# Patient Record
Sex: Female | Born: 1990
Health system: Southern US, Community
[De-identification: ages and names within clinical notes are randomized; demographics above are authoritative.]

## PROBLEM LIST (undated history)

## (undated) DIAGNOSIS — E559 Vitamin D deficiency, unspecified: Secondary | ICD-10-CM

## (undated) DIAGNOSIS — O24419 Gestational diabetes mellitus in pregnancy, unspecified control: Secondary | ICD-10-CM

## (undated) HISTORY — DX: Gestational diabetes mellitus in pregnancy, unspecified control: O24.419

## (undated) HISTORY — DX: Vitamin D deficiency, unspecified: E55.9

---

## 2019-05-31 DIAGNOSIS — Z0000 Encounter for general adult medical examination without abnormal findings: Secondary | ICD-10-CM | POA: Diagnosis not present

## 2019-05-31 DIAGNOSIS — Z23 Encounter for immunization: Secondary | ICD-10-CM | POA: Diagnosis not present

## 2019-05-31 DIAGNOSIS — Z1389 Encounter for screening for other disorder: Secondary | ICD-10-CM | POA: Diagnosis not present

## 2019-05-31 DIAGNOSIS — Z13220 Encounter for screening for lipoid disorders: Secondary | ICD-10-CM | POA: Diagnosis not present

## 2019-05-31 DIAGNOSIS — R21 Rash and other nonspecific skin eruption: Secondary | ICD-10-CM | POA: Diagnosis not present

## 2019-06-07 ENCOUNTER — Other Ambulatory Visit: Payer: Self-pay

## 2019-06-07 DIAGNOSIS — O209 Hemorrhage in early pregnancy, unspecified: Secondary | ICD-10-CM | POA: Diagnosis not present

## 2019-06-07 DIAGNOSIS — Z20822 Contact with and (suspected) exposure to covid-19: Secondary | ICD-10-CM

## 2019-06-07 DIAGNOSIS — Z3201 Encounter for pregnancy test, result positive: Secondary | ICD-10-CM | POA: Diagnosis not present

## 2019-06-08 LAB — NOVEL CORONAVIRUS, NAA: SARS-CoV-2, NAA: NOT DETECTED

## 2019-06-13 DIAGNOSIS — Z3201 Encounter for pregnancy test, result positive: Secondary | ICD-10-CM | POA: Diagnosis not present

## 2019-06-13 DIAGNOSIS — Z3490 Encounter for supervision of normal pregnancy, unspecified, unspecified trimester: Secondary | ICD-10-CM | POA: Diagnosis not present

## 2019-06-17 DIAGNOSIS — O209 Hemorrhage in early pregnancy, unspecified: Secondary | ICD-10-CM | POA: Diagnosis not present

## 2019-06-25 DIAGNOSIS — O209 Hemorrhage in early pregnancy, unspecified: Secondary | ICD-10-CM | POA: Diagnosis not present

## 2019-07-05 DIAGNOSIS — Z23 Encounter for immunization: Secondary | ICD-10-CM | POA: Diagnosis not present

## 2019-07-05 DIAGNOSIS — Z3401 Encounter for supervision of normal first pregnancy, first trimester: Secondary | ICD-10-CM | POA: Diagnosis not present

## 2019-09-06 NOTE — L&D Delivery Note (Signed)
Delivery Note   Patient Name: Andrea Escobar DOB: 1990-09-19 MRN: [id number]  Date of admission: 11/27/2019 Delivering MD: Dorisann Frames K  Date of delivery: 12/11/2019 Type of delivery: SVD  Newborn Data: Live born female  Birth Weight: 4 lb 2 oz (1870 g) APGAR: 9, 10  Newborn Delivery   Birth date/time: 12/11/2019 03:45:00 Delivery type: Vaginal, Spontaneous     [name], 29 y.o., @ [redacted]w[redacted]d,  G1P0101, who was admitted for PPROM on 11/27/2019. She was transferred to L&D from antepartum and was C/+2. I was called to the room when she progressed +2 station in the second stage of labor. She delivered a viable infant, cephalic and restituted to the ROA position.  A nuchal cord  was not identified. The baby was placed on maternal abdomen while initial step of NRP were perfmored (Dry, Stimulated, and warmed). Hat placed on baby for thermoregulation. Delayed cord clamping was performed for 1 minute.  Cord double clamped and cut.  Cord cut by FOB. Apgar scores were 9 and 10. Prophylactic Pitocin was started in the third stage of labor for active management. Upon placental separation, cord avulsed and Dr. Adrian Blackwater was called to room for manual extraction of the placenta. Please see note from Dr. Adrian Blackwater. A 3 vessel cord was noted and placenta was sent to pathology.  Inspection revealed 1st degree laceration. The repair was done under epidural anesthesia. The uterus was firm, bleeding stable. Placenta and umbilical artery blood gas were not sent.  Baby to NICU. Mom in stable condition.  Maternal Info: Anesthesia:Epidural Episiotomy: N/A Lacerations:  1st degree Suture Repair: Yes, 3-0 Vicryl Est. Blood Loss (mL):  427  Newborn Info: Baby Sex: Female APGAR (1 MIN): 9   APGAR (5 MINS): 10    Mom to postpartum.  Baby to NICU.  June Leap, CNM, MSN 12/11/2019 4:42 AM

## 2019-09-19 DIAGNOSIS — Z36 Encounter for antenatal screening for chromosomal anomalies: Secondary | ICD-10-CM | POA: Diagnosis not present

## 2019-09-19 DIAGNOSIS — Z3402 Encounter for supervision of normal first pregnancy, second trimester: Secondary | ICD-10-CM | POA: Diagnosis not present

## 2019-11-08 DIAGNOSIS — Z3402 Encounter for supervision of normal first pregnancy, second trimester: Secondary | ICD-10-CM | POA: Diagnosis not present

## 2019-11-27 ENCOUNTER — Other Ambulatory Visit: Payer: Self-pay

## 2019-11-27 ENCOUNTER — Encounter (HOSPITAL_COMMUNITY): Payer: Self-pay | Admitting: Obstetrics & Gynecology

## 2019-11-27 ENCOUNTER — Encounter (HOSPITAL_COMMUNITY): Payer: Self-pay | Admitting: Anesthesiology

## 2019-11-27 ENCOUNTER — Inpatient Hospital Stay (HOSPITAL_COMMUNITY)
Admission: AD | Admit: 2019-11-27 | Discharge: 2019-12-13 | DRG: 798 | Disposition: A | Discharge status: Home / Self Care | Payer: Medicaid Other | Attending: Obstetrics & Gynecology | Admitting: Obstetrics & Gynecology

## 2019-11-27 ENCOUNTER — Inpatient Hospital Stay (HOSPITAL_COMMUNITY): Payer: Medicaid Other

## 2019-11-27 DIAGNOSIS — K5900 Constipation, unspecified: Secondary | ICD-10-CM | POA: Diagnosis present

## 2019-11-27 DIAGNOSIS — O24419 Gestational diabetes mellitus in pregnancy, unspecified control: Secondary | ICD-10-CM

## 2019-11-27 DIAGNOSIS — Z3A30 30 weeks gestation of pregnancy: Secondary | ICD-10-CM

## 2019-11-27 DIAGNOSIS — O42913 Preterm premature rupture of membranes, unspecified as to length of time between rupture and onset of labor, third trimester: Secondary | ICD-10-CM | POA: Diagnosis present

## 2019-11-27 DIAGNOSIS — Z3A29 29 weeks gestation of pregnancy: Secondary | ICD-10-CM

## 2019-11-27 DIAGNOSIS — O24420 Gestational diabetes mellitus in childbirth, diet controlled: Secondary | ICD-10-CM | POA: Diagnosis present

## 2019-11-27 DIAGNOSIS — O42919 Preterm premature rupture of membranes, unspecified as to length of time between rupture and onset of labor, unspecified trimester: Secondary | ICD-10-CM | POA: Diagnosis present

## 2019-11-27 DIAGNOSIS — O26893 Other specified pregnancy related conditions, third trimester: Secondary | ICD-10-CM | POA: Diagnosis present

## 2019-11-27 DIAGNOSIS — Z20822 Contact with and (suspected) exposure to covid-19: Secondary | ICD-10-CM | POA: Diagnosis present

## 2019-11-27 DIAGNOSIS — O24313 Unspecified pre-existing diabetes mellitus in pregnancy, third trimester: Secondary | ICD-10-CM | POA: Diagnosis not present

## 2019-11-27 DIAGNOSIS — O42113 Preterm premature rupture of membranes, onset of labor more than 24 hours following rupture, third trimester: Secondary | ICD-10-CM | POA: Diagnosis not present

## 2019-11-27 LAB — TYPE AND SCREEN
ABO/RH(D): B POS
Antibody Screen: NEGATIVE

## 2019-11-27 LAB — GROUP B STREP BY PCR: Group B strep by PCR: NEGATIVE

## 2019-11-27 LAB — SARS CORONAVIRUS 2 (TAT 6-24 HRS): SARS Coronavirus 2: NEGATIVE

## 2019-11-27 LAB — CBC
HCT: 33.6 % — ABNORMAL LOW (ref 36.0–46.0)
Hemoglobin: 10.5 g/dL — ABNORMAL LOW (ref 12.0–15.0)
MCH: 24.3 pg — ABNORMAL LOW (ref 26.0–34.0)
MCHC: 31.3 g/dL (ref 30.0–36.0)
MCV: 77.8 fL — ABNORMAL LOW (ref 80.0–100.0)
Platelets: 189 10*3/uL (ref 150–400)
RBC: 4.32 MIL/uL (ref 3.87–5.11)
RDW: 16.3 % — ABNORMAL HIGH (ref 11.5–15.5)
WBC: 13.3 10*3/uL — ABNORMAL HIGH (ref 4.0–10.5)
nRBC: 0 % (ref 0.0–0.2)

## 2019-11-27 LAB — URINALYSIS, ROUTINE W REFLEX MICROSCOPIC
Bilirubin Urine: NEGATIVE
Glucose, UA: NEGATIVE mg/dL
Hgb urine dipstick: NEGATIVE
Ketones, ur: NEGATIVE mg/dL
Leukocytes,Ua: NEGATIVE
Nitrite: NEGATIVE
Protein, ur: NEGATIVE mg/dL
Specific Gravity, Urine: 1.008 (ref 1.005–1.030)
pH: 6 (ref 5.0–8.0)

## 2019-11-27 LAB — GLUCOSE, CAPILLARY
Glucose-Capillary: 176 mg/dL — ABNORMAL HIGH (ref 70–99)
Glucose-Capillary: 103 mg/dL — ABNORMAL HIGH (ref 70–99)

## 2019-11-27 LAB — ABO/RH: ABO/RH(D): B POS

## 2019-11-27 LAB — POCT FERN TEST: POCT Fern Test: POSITIVE

## 2019-11-27 MED ORDER — DOCUSATE SODIUM 100 MG PO CAPS
100.0000 mg | ORAL_CAPSULE | Freq: Every day | ORAL | Status: DC
  Administered 2019-11-27 – 2019-12-02 (×6): 100 mg via ORAL
  Filled 2019-11-27 (×6): qty 1

## 2019-11-27 MED ORDER — ACETAMINOPHEN 325 MG PO TABS
650.0000 mg | ORAL_TABLET | ORAL | Status: DC | PRN
  Administered 2019-12-09: 05:00:00 650 mg via ORAL
  Filled 2019-11-27: qty 2

## 2019-11-27 MED ORDER — AZITHROMYCIN 250 MG PO TABS
1000.0000 mg | ORAL_TABLET | Freq: Once | ORAL | Status: AC
  Administered 2019-11-27: 1000 mg via ORAL
  Filled 2019-11-27: qty 4

## 2019-11-27 MED ORDER — INDOMETHACIN 25 MG PO CAPS
25.0000 mg | ORAL_CAPSULE | Freq: Four times a day (QID) | ORAL | Status: AC
  Administered 2019-11-27 – 2019-11-30 (×12): 25 mg via ORAL
  Filled 2019-11-27 (×13): qty 1

## 2019-11-27 MED ORDER — TETANUS-DIPHTH-ACELL PERTUSSIS 5-2.5-18.5 LF-MCG/0.5 IM SUSP
0.5000 mL | Freq: Once | INTRAMUSCULAR | Status: AC
  Administered 2019-11-27: 0.5 mL via INTRAMUSCULAR
  Filled 2019-11-27: qty 0.5

## 2019-11-27 MED ORDER — TERBUTALINE SULFATE 1 MG/ML IJ SOLN: 0.2500 mg | Freq: Once | INTRAMUSCULAR | Status: DC | PRN

## 2019-11-27 MED ORDER — CALCIUM CARBONATE ANTACID 500 MG PO CHEW: 2.0000 | CHEWABLE_TABLET | ORAL | Status: DC | PRN

## 2019-11-27 MED ORDER — AMOXICILLIN 500 MG PO CAPS
500.0000 mg | ORAL_CAPSULE | Freq: Three times a day (TID) | ORAL | Status: AC
  Administered 2019-11-29 – 2019-12-03 (×15): 500 mg via ORAL
  Filled 2019-11-27 (×15): qty 1

## 2019-11-27 MED ORDER — SODIUM CHLORIDE 0.9 % IV SOLN
2.0000 g | Freq: Four times a day (QID) | INTRAVENOUS | Status: AC
  Administered 2019-11-27 – 2019-11-29 (×8): 2 g via INTRAVENOUS
  Filled 2019-11-27 (×8): qty 2000

## 2019-11-27 MED ORDER — LACTATED RINGERS IV SOLN: INTRAVENOUS | Status: DC

## 2019-11-27 MED ORDER — ZOLPIDEM TARTRATE 5 MG PO TABS: 5.0000 mg | ORAL_TABLET | Freq: Every evening | ORAL | Status: DC | PRN

## 2019-11-27 MED ORDER — BETAMETHASONE SOD PHOS & ACET 6 (3-3) MG/ML IJ SUSP
12.0000 mg | INTRAMUSCULAR | Status: AC
  Administered 2019-11-27 – 2019-11-28 (×2): 12 mg via INTRAMUSCULAR
  Filled 2019-11-27: qty 5

## 2019-11-27 MED ORDER — PRENATAL MULTIVITAMIN CH
1.0000 | ORAL_TABLET | Freq: Every day | ORAL | Status: DC
  Administered 2019-11-27 – 2019-12-08 (×12): 1 via ORAL
  Filled 2019-11-27 (×12): qty 1

## 2019-11-27 MED ORDER — INDOMETHACIN 25 MG PO CAPS
50.0000 mg | ORAL_CAPSULE | Freq: Once | ORAL | Status: AC
  Administered 2019-11-27: 50 mg via ORAL
  Filled 2019-11-27: qty 2

## 2019-11-27 NOTE — ED Triage Notes (Addendum)
Pt [redacted]w[redacted]d pregnant.  Reports clear fluid leaking since 3am.  States she had abd pain earlier but denies pain at present.  G1.  Dr. Anitra Lauth at triage for [facility].  Report called to [facility] in MAU. Pt transported via wheelchair to MAU by EMT.

## 2019-11-27 NOTE — Plan of Care (Signed)
  Problem: Education: Goal: Knowledge of General Education information will improve Description: Including pain rating scale, medication(s)/side effects and non-pharmacologic comfort measures Outcome: Completed/Met

## 2019-11-27 NOTE — Consult Note (Signed)
   Prenatal Consult       11/27/2019  9:06 PM   I was asked by Dr. Charlotta Newton to consult on this patient for possible preterm delivery.  I had the pleasure of meeting with her today, and a [place] virtual interpreter was used to facilitate the visit.  She is a G1P0 admitted today for PPROM at 30+[redacted] weeks gestation.  Pregnancy has otherwise been complicated by recently diagnosed gestational diabetes.  I explained that the neonatal intensive care team would be present for the delivery and outlined the likely delivery room course for this baby including routine resuscitation and NRP-guided approaches to the treatment of respiratory distress. We discussed other common problems associated with prematurity including respiratory distress syndrome/CLD, apnea, feeding issues, temperature regulation, and infection risk.  We briefly discussed IVH/PVL, ROP, and NEC and that these are complications associated with prematurity, but that by 30 weeks are uncommon.    We discussed the average length of stay but I noted that the actual LOS would depend on the severity of problems encountered and response to treatments.  We discussed visitation policies and the resources available while her child is in the hospital.  We discussed the importance of good nutrition and various methods of providing nutrition (parenteral hyperalimentation, gavage feedings and/or oral feeding). We discussed the benefits of human milk. I encouraged breast feeding and pumping soon after birth and outlined resources that are available to support breast feeding.   Thank you for involving [place] in the care of this patient. A member of our team will be available should the family have additional questions.  Time for consultation approximately 40 minutes.   _____________________ Electronically Signed By: Karie Schwalbe, MD, MS Neonatologist

## 2019-11-27 NOTE — MAU Note (Signed)
Andrea Escobar is a 29 y.o. at [redacted]w[redacted]d here in MAU reporting: had lower abdominal pain yesterday. Was not able to sleep due to the pain. Around 0300 she went to the bathroom and her underwear was wet but it was not urinate, had this happen 2 times. Is not wearing a pad. No recent IC. No bleeding. +FM  Onset of complaint: today  Pain score: 10/10  Vitals:   11/27/19 0720 11/27/19 0759  BP: 110/81 110/67  Pulse: 98 99  Resp: 14 16  Temp: 98.9 F (37.2 C) 99.5 F (37.5 C)  SpO2: 99% 100%     FHT: +FM  Lab orders placed from triage: UA

## 2019-11-27 NOTE — MAU Provider Note (Signed)
S: Andrea Escobar is a 29 y.o. G1P0 at [redacted]w[redacted]d  who presents to MAU today complaining of leaking of fluid since 0300. She denies vaginal bleeding. She endorses abdominal pain since . She reports normal fetal movement.    O: BP 110/67 (BP Location: Right Arm)   Pulse 99   Temp 99.5 F (37.5 C) (Oral)   Resp 16   Ht 5\' 6"  (1.676 m)   Wt 90.4 kg   SpO2 100% Comment: room air  BMI 32.18 kg/m  GENERAL: Well-developed, well-nourished female in no acute distress.  HEAD: Normocephalic, atraumatic.  CHEST: Normal effort of breathing, regular heart rate ABDOMEN: Soft, nontender, gravid PELVIC: Deferred. Grossly rupture of membranes seen.   Cervical exam:  Deferred  Fetal Monitoring: Baseline: 150 Variability: moderate Accelerations: present Decelerations: absent Contractions: irregular contractions and UI noted  No results found for this or any previous visit (from the past 24 hour(s)).   A: SIUP at [redacted]w[redacted]d  PPROM  *Consult with Dr. [redacted]w[redacted]d @ 0840 via Charlotta Newton, RN (in surgery) - notified of patient's complaints, assessments, (+) fern results, tx plan rapid GBS testing, BMZ 12 mg IM and admission - Dr. Marvell Fuller to call back after surgery.  Dr. Charlotta Newton on the unit @ 0915 -- update given. In to see patient and assumes care at this time.   P: Preterm premature rupture of membranes in third trimester, unspecified duration to onset of labor - Admit to [facility] unit - Dr. [facility] will place admission orders - BMZ 12 mg 2nd injection to be repeated in 24 hrs   Charlotta Newton, CNM 11/27/2019, 8:35 AM

## 2019-11-27 NOTE — H&P (Signed)
HPI: 29 y/o G1P0 @ [redacted]w[redacted]d estimated gestational age (as dated by early [place]- @ 7wks) admitted for PPROM.  Pt presented to MAU due to leaking of fluid since 0300.  She denies pelvic or abdominal pain.  She has noted intermittent cramping throughout the week, but pain was irregular and not strong.  Notes the pain was in her lower abdomen.  She denies pain currently.  No vaginal bleeding. +fetal movement.  No other acute complaints  ROS: no HA, no epigastric pain, no visual changes.    Pregnancy complicated by: 1)Gestational diabetes -recently diagnosed on 11/22/19- failed 3hr (94, 177, 193, 149), prior 1hr test 157   Prenatal Transfer Tool  Maternal Diabetes: Yes:  Diabetes Type:  Diet controlled- newly diagnosed Genetic Screening: Normal Maternal Ultrasounds/Referrals: Normal Fetal Ultrasounds or other Referrals:  None Maternal Substance Abuse:  No Significant Maternal Medications:  None Significant Maternal Lab Results: None   PNL:  GBS negative, Rub Immune, Hep B neg, RPR NR, HIV neg, GC/C neg, glucola:abnormal Hgb: 10.5 Blood type: B positive, antibody neg  Immunizations: Tdap: not yet given Flu: 07/05/19  OBHx: primip PMHx:  none Meds:  PNV Allergy:  No Known Allergies SurgHx: none SocHx:   no Tobacco, no  EtOH, no Illicit Drugs  O: BP 102/61 (BP Location: Right Arm)   Pulse 93   Temp 98.4 F (36.9 C) (Oral)   Resp 17   Ht 5\' 6"  (1.676 m)   Wt 90.4 kg   SpO2 100%   BMI 32.18 kg/m  Gen. AAOx3, NAD CV.  RRR  No murmur.  Resp. CTAB, no wheeze or crackles. Abd. Gravid,  no tenderness,  no rigidity,  no guarding Extr.  no edema B/L , no calf tenderness, neg Homan's B/L FHT: 150 baseline, moderate variability, + accels,  no decels Toco: irritability noted SVE: deferred  SSE: per MAU- grossly ruptured BSUS: VTX   Labs: see orders  A/P:  29 y.o. G1P0 @ [redacted]w[redacted]d EGA who presents for PPROM  1) FWB:  Cat. I - plan for fetal monitoring q shift x [redacted]w[redacted]d -BMZ to be given for  fetal lung maturity -MFM consult and to be done, appreciate further recommendations regarding follow up US -Neonatology consulted  2) Preterm labor -Latency antibiotics ordered -Indomethacin for tocolysis -currently no evidence of infection -UA and culture pending, GBS negative -should pt note signs of labor, plan to start Magnesium for CP prophylaxis -continue IVF @ 125cc/hr  3) Gestational DM -diabetic coordinator consulted -accuchecks fasting and 2hr postprandial -Since she is newly diagnosed will hold off on medication until teaching has been completed and in light of recent BMZ -gestational diabetic diet ordered -plan to closely monitor  4) Maternal care -Tylenol prn pain -Bedrest with bathroom privileges -ok for wheelchair rides  [place], Myna H[place]go [place] (cell) [phone] (office)

## 2019-11-28 LAB — URINE CULTURE: Culture: NO GROWTH

## 2019-11-28 LAB — GLUCOSE, CAPILLARY
Glucose-Capillary: 110 mg/dL — ABNORMAL HIGH (ref 70–99)
Glucose-Capillary: 96 mg/dL (ref 70–99)
Glucose-Capillary: 199 mg/dL — ABNORMAL HIGH (ref 70–99)
Glucose-Capillary: 146 mg/dL — ABNORMAL HIGH (ref 70–99)
Glucose-Capillary: 135 mg/dL — ABNORMAL HIGH (ref 70–99)

## 2019-11-28 MED ORDER — INSULIN ASPART 100 UNIT/ML ~~LOC~~ SOLN: 0.0000 [IU] | Freq: Three times a day (TID) | SUBCUTANEOUS | Status: DC

## 2019-11-28 MED ORDER — INSULIN ASPART 100 UNIT/ML ~~LOC~~ SOLN
0.0000 [IU] | Freq: Three times a day (TID) | SUBCUTANEOUS | Status: DC
  Administered 2019-11-28: 2 [IU] via SUBCUTANEOUS
  Administered 2019-11-29 (×3): 1 [IU] via SUBCUTANEOUS
  Administered 2019-11-30: 2 [IU] via SUBCUTANEOUS
  Administered 2019-11-30 – 2019-12-01 (×3): 1 [IU] via SUBCUTANEOUS

## 2019-11-28 NOTE — Consult Note (Signed)
MFM Note  Andrea Escobar was seen in consultation due to PPROM.  She is currently at 29 weeks 5 days.  This is her first pregnancy. The patient reports that she probably ruptured membranes at around 3 am this morning.  She reports that she is still leaking a small amount of fluid and reports feeling fetal movements throughout the day. She also notes intermittent mild lower abdominal cramping. She was recently diagnosed with gestational diabetes. She denies any significant past medical history.  The patient had an ultrasound performed this afternoon that showed an overall EFW of 3 pounds 8 ounces (68th percentile for her gestational age). Normal amniotic fluid with a total AFI of 12.78 cm was noted. The fetus was in the vertex presentation. Her cervix was noted to be funneling and probably is fingertip to 1 cm dilated as noted on the abdominal ultrasound.   Due to PPROM at her current gestational age, the patient is receiving latency antibiotics and a complete course of antenatal corticosteroids.  The usual management and implications of PPROM were discussed with the patient.  She was advised that due to rupture of membranes, she will require inpatient management until delivery with daily fetal testing.  She should receive a complete course of antenatal corticosteroids and complete a course of latency antibiotics.  She was advised that due to PPROM, delivery will be recommended at around 34 weeks.  Delivery prior to this time will be indicated should she go into spontaneous labor, should she show any signs of an intrauterine infection, or at any time for nonreassuring fetal status.  Should she require delivery before 32 weeks, magnesium sulfate should be given for fetal neuro protection.  Should she be at risk for delivery and it has been 7 days or greater since she received the initial course of antenatal corticosteroids, a rescue course of steroids should be given.  The patient understands that her  baby will require a NICU admission following delivery.  Due to gestational diabetes, the patient should continue to have her fingersticks monitored on a daily basis and should be started on either Metformin or insulin should the majority of her fingerstick values be elevated above the normal range (fasting fingerstick values of 95 mg/dL or less  and 2 hours postprandial values of 120 mg/dL or less).  All conversations were held with the patient with the help of a [place] interpreter.  At the end of the consultation, the patient stated that all her questions had been answered to her complete satisfaction.  Thank you for referring this patient for Maternal-Fetal Medicine consultation.   Recommendations: -Inpatient management until delivery -Daily fetal testing -Weekly biophysical profiles with fluid checks -Latency antibiotics -Magnesium sulfate for fetal neuro protection should she require delivery before 32 weeks -Rescue course of steroids as indicated -Delivery at around 34 weeks or earlier should she go into spontaneous labor, should she show any signs of an intrauterine infection, or at any time for nonreassuring fetal status -Monitor daily fingerstick values and institute appropriate treatment if necessary

## 2019-11-28 NOTE — Progress Notes (Signed)
HD #2- PPROM @ 30wks  S: She is resting comfortably in bed and reports no acute complaints.  No contractions, vaginal bleeding, still leaking small amount of fluid.  She is feeling baby move, but less than before.    O: BP (!) 94/58 (BP Location: Right Arm)   Pulse 85   Temp 98 F (36.7 C) (Oral)   Resp 16   Ht 5\' 6"  (1.676 m)   Wt 90.4 kg   SpO2 98%   BMI 32.18 kg/m   Gen: NAD CV: RRR Lungs: CTAB Abd: gravid and non-tender Ext: no edema, no calf tenderness bilaterally  FHT: 140, moderate variability, +accels, no decels Toco: no contractions  on 3/24: vertex/right lateral placenta/AFI 12.7/EFW: 3#8oz (68%) Results for orders placed or performed during the hospital encounter of 11/27/19 (from the past 24 hour(s))  Urinalysis, Routine w reflex microscopic     Status: Abnormal   Collection Time: 11/27/19  8:01 AM  Result Value Ref Range   Color, Urine STRAW (A) YELLOW   APPearance CLEAR CLEAR   Specific Gravity, Urine 1.008 1.005 - 1.030   pH 6.0 5.0 - 8.0   Glucose, UA NEGATIVE NEGATIVE mg/dL   Hgb urine dipstick NEGATIVE NEGATIVE   Bilirubin Urine NEGATIVE NEGATIVE   Ketones, ur NEGATIVE NEGATIVE mg/dL   Protein, ur NEGATIVE NEGATIVE mg/dL   Nitrite NEGATIVE NEGATIVE   Leukocytes,Ua NEGATIVE NEGATIVE  Group B strep by PCR     Status: None   Collection Time: 11/27/19  8:37 AM   Specimen: Vaginal/Rectal; Genital  Result Value Ref Range   Group B strep by PCR NEGATIVE NEGATIVE  Fern Test     Status: None   Collection Time: 11/27/19  8:39 AM  Result Value Ref Range   POCT Fern Test Positive = ruptured amniotic membanes   Type and screen [facility]     Status: None   Collection Time: 11/27/19  9:03 AM  Result Value Ref Range   ABO/RH(D) B POS    Antibody Screen NEG    Sample Expiration      11/30/2019,2359 Performed at [facility], [address], [place], [place]   ABO/Rh     Status: None   Collection Time: 11/27/19  9:03 AM   Result Value Ref Range   ABO/RH(D)      B POS Performed at [facility], [address], [place], [place]   SARS CORONAVIRUS 2 (TAT 6-24 HRS) Nasopharyngeal Nasopharyngeal Swab     Status: None   Collection Time: 11/27/19  9:15 AM   Specimen: Nasopharyngeal Swab  Result Value Ref Range   SARS Coronavirus 2 NEGATIVE NEGATIVE  CBC on admission     Status: Abnormal   Collection Time: 11/27/19  9:22 AM  Result Value Ref Range   WBC 13.3 (H) 4.0 - 10.5 K/uL   RBC 4.32 3.87 - 5.11 MIL/uL   Hemoglobin 10.5 (L) 12.0 - 15.0 g/dL   HCT 11/29/19 (L) 60.6 - 30.1 %   MCV 77.8 (L) 80.0 - 100.0 fL   MCH 24.3 (L) 26.0 - 34.0 pg   MCHC 31.3 30.0 - 36.0 g/dL   RDW 60.1 (H) 09.3 - 23.5 %   Platelets 189 150 - 400 K/uL   nRBC 0.0 0.0 - 0.2 %  Glucose, capillary     Status: Abnormal   Collection Time: 11/27/19  2:02 PM  Result Value Ref Range   Glucose-Capillary 176 (H) 70 -  99 mg/dL  Glucose, capillary     Status: Abnormal   Collection Time: 11/27/19  9:17 PM  Result Value Ref Range   Glucose-Capillary 103 (H) 70 - 99 mg/dL   Comment 1 Notify RN      A/P:  29 y.o. G1P0 @ [redacted]w[redacted]d EGA who presents for PPROM  1) FWB:  Cat. I - continue fetal monitoring q shift x 62min -BMZ course - 2nd dose today -plan for rescue BMZ if pt goes into labor 7 days after steroid complete -s/p MFM and Neonatology consult  -plan for BPP weekly (ordered for Tuesday)  2) Preterm labor -continue latency antibiotics -Indomethacin for tocolysis -currently no evidence of infection -UA and culture pending, GBS negative -should pt note signs of labor, plan to start Magnesium for CP prophylaxis  3) Gestational DM -diabetic coordinator consulted -accuchecks fasting and 2hr postprandial -Since she is newly diagnosed will hold off on medication until teaching has been completed and in light of recent [place] -gestational diabetic diet ordered -plan to closely monitor  4) Maternal care -Tylenol prn  pain -Bedrest with bathroom privileges -ok for wheelchair rides and shower  Janyth Pupa, DO [phone] (cell) [phone] (office)

## 2019-11-28 NOTE — Progress Notes (Addendum)
Inpatient Diabetes Program Recommendations  Inpatient Diabetes Program Recommendations  Diabetes Treatment Program Recommendations  ADA Standards of Care 2018 Diabetes in Pregnancy Target Glucose Ranges:  Fasting: 60 - 90 mg/dL Preprandial: 60 - 553 mg/dL 1 hr postprandial: Less than 140mg /dL (from first bite of meal) 2 hr postprandial: Less than 120 mg/dL (from first bite of meal)    Lab Results  Component Value Date   GLUCAP 110 (H) 11/28/2019    Review of Glycemic Control Results for Andrea Escobar, Andrea Escobar (MRN [name]) as of 11/28/2019 08:43  Ref. Range 11/27/2019 21:17 11/28/2019 07:54  Glucose-Capillary Latest Ref Range: 70 - 99 mg/dL 11/30/2019 (H) 754 (H)   Diabetes history: GDM Outpatient Diabetes medications: none Current orders for Inpatient glycemic control: none BMZ x 2  Inpatient Diabetes Program Recommendations:    Noted consult.   If trends continue above target goals in the setting of steroids, consider adding Novolog 0-14 units TID.   Will plan to see today.   Addendum: Spoke with patient and significant other regarding new diagnosis of GDM.  Explained what a A1c is and what it measures. Also reviewed goal A1c with patient, importance of good glucose control @ home, and blood sugar goals. Reviewed frequency of blood sugar checks, hormonal fluctuations in pregnancy, increase in insulin resistance, risk factors affecting pregnancy, neonatal concerns including hyperinsulinemia, risk for developing type 2 DM, importance of following up with PCP following delivery, plate method, carbohydrate alottements while inpatient/bringing in food during pregnancy, importance of eliminating sugary beverages and impact of steroids to glucose trends. Patient has no further questions at this time.  Dietitian consult placed to further reinforce.  Thanks, 492, MSN, RNC-OB Diabetes Coordinator [phone] (8a-5p)

## 2019-11-28 NOTE — Hospital Course (Addendum)
Admitted 3/24 for PPROM @ 0300  BMZ- 3/24-3/25

## 2019-11-29 LAB — GLUCOSE, CAPILLARY
Glucose-Capillary: 105 mg/dL — ABNORMAL HIGH (ref 70–99)
Glucose-Capillary: 113 mg/dL — ABNORMAL HIGH (ref 70–99)
Glucose-Capillary: 114 mg/dL — ABNORMAL HIGH (ref 70–99)
Glucose-Capillary: 94 mg/dL (ref 70–99)
Glucose-Capillary: 98 mg/dL (ref 70–99)

## 2019-11-29 NOTE — Progress Notes (Signed)
Nutrition  29 yo patient adm w/ PROM. Now 30 2/7 weeks. Newly diagnosed with GDM. Expected long term admission.  Pt with concerns that she does not have enough food to eat to allow baby to grow. Understands the restrictions of the GDM diet. Wants to eat food prepared by husband that he will cook and bring to her.  ~Have asked Pt to order double protein portions when she eats meals ordered here. ( RN helps pt with ordering )   ~ Pt described the basic diet parameters to me. Understands small portions of starchy foods and fruits, no juice/ sodas. Presented pt with handout on diet of diabetes in french and english for reference  ~ Detailed description of foods to be prepared at home and brought in by husband for pt to eat. All fall within her GDM diet restrictions    Elisabeth Cara M.Odis Luster LDN Neonatal Nutrition Support Specialist/RD III

## 2019-11-29 NOTE — Progress Notes (Signed)
HD #3- PPROM @ 30wks  S: She is resting comfortably in bed.  No contractions, vaginal bleeding, still leaking small amount of fluid.  +FM.  No F/C/CP/SOB.  No acute complaints  O: BP 110/64 (BP Location: Right Arm)   Pulse 86   Temp 98 F (36.7 C) (Oral)   Resp 18   Ht 5\' 6"  (1.676 m)   Wt 90.4 kg   SpO2 99%   BMI 32.18 kg/m   Gen: NAD CV: RRR Lungs: CTAB Abd: gravid and non-tender Ext: no edema, no calf tenderness bilaterally  FHT: 145, moderate variability, +accels, no decels Toco: no contractions  on 3/24: vertex/right lateral placenta/AFI 12.7/EFW: 3#8oz (68%) Results for orders placed or performed during the hospital encounter of 11/27/19 (from the past 24 hour(s))  Glucose, capillary     Status: Abnormal   Collection Time: 11/28/19  7:54 AM  Result Value Ref Range   Glucose-Capillary 110 (H) 70 - 99 mg/dL  Glucose, capillary     Status: None   Collection Time: 11/28/19 10:03 AM  Result Value Ref Range   Glucose-Capillary 96 70 - 99 mg/dL  Glucose, capillary     Status: Abnormal   Collection Time: 11/28/19  2:59 PM  Result Value Ref Range   Glucose-Capillary 199 (H) 70 - 99 mg/dL  Glucose, capillary     Status: Abnormal   Collection Time: 11/28/19  8:39 PM  Result Value Ref Range   Glucose-Capillary 146 (H) 70 - 99 mg/dL  Glucose, capillary     Status: Abnormal   Collection Time: 11/28/19 10:50 PM  Result Value Ref Range   Glucose-Capillary 135 (H) 70 - 99 mg/dL  Glucose, capillary     Status: Abnormal   Collection Time: 11/29/19  6:33 AM  Result Value Ref Range   Glucose-Capillary 105 (H) 70 - 99 mg/dL     A/P:  29 y.o. 36 @ [redacted]w[redacted]d EGA who presents for PPROM  1) FWB:  Cat. I - continue fetal monitoring q shift x [redacted]w[redacted]d -BMZ completed 3/24-3/25 -plan for rescue BMZ if pt goes into labor 7 days after steroid complete -s/p MFM and Neonatology consult  -plan for BPP weekly (ordered for Tuesday)  2) Preterm labor -continue latency  antibiotics -Tocolysis with indomethacin x 72hr -currently no evidence of infection -UA and culture negative, GBS negative -should pt note signs of labor, plan to start Magnesium for CP prophylaxis  3) Gestational DM -appreciate diabetic coordinator consult- started on Novolog 0-14 tid for meal coverage -accuchecks fasting and 2hr postprandial -gestational diabetic diet ordered -plan to closely monitor  4) Maternal care -Tylenol prn pain -Bedrest with bathroom privileges -ok for wheelchair rides and shower  Monday, DO [phone] (cell) [phone] (office)

## 2019-11-30 LAB — GLUCOSE, CAPILLARY
Glucose-Capillary: 96 mg/dL (ref 70–99)
Glucose-Capillary: 123 mg/dL — ABNORMAL HIGH (ref 70–99)
Glucose-Capillary: 111 mg/dL — ABNORMAL HIGH (ref 70–99)
Glucose-Capillary: 89 mg/dL (ref 70–99)
Glucose-Capillary: 94 mg/dL (ref 70–99)

## 2019-11-30 LAB — TYPE AND SCREEN
ABO/RH(D): B POS
Antibody Screen: NEGATIVE

## 2019-11-30 MED ORDER — BISACODYL 10 MG RE SUPP
10.0000 mg | Freq: Once | RECTAL | Status: AC
  Administered 2019-11-30: 10 mg via RECTAL
  Filled 2019-11-30: qty 1

## 2019-11-30 NOTE — Progress Notes (Signed)
HD#4 PPROM @ 30 wks   S: pt without  Complaints.. reports minimal leakage of fluid no contractions no bleeding + FM.   O:  Vitals:   11/29/19 2345 11/30/19 0554 11/30/19 0817 11/30/19 1204  BP: 101/61 (!) 104/56 100/64 100/62  Pulse: 94 91 85 93  Resp: 17 18 16 18   Temp: 98.4 F (36.9 C) 98.3 F (36.8 C) 98.3 F (36.8 C) 98.2 F (36.8 C)  TempSrc: Oral Oral Oral Oral  SpO2: 96% 97% 96% 98%  Weight:      Height:       Genaral : NAD Abdomen Gravid nontender  Ext: No edema no calf tenderness   FHT. Baseline 140's reactive   on 3/24: vertex/ right lateral placenta/ AFI 12.7/ EFW 3#8oz  A/P:29 y.o.G1P0@ [redacted]w[redacted]d EGA who presents forPPROM  1)FWB:Cat. I - continue fetal monitoring q shift x [redacted]w[redacted]d -BMZ completed 3/24-3/25 -plan for rescue BMZ if pt goes into labor 7 days after steroid complete -s/p MFM and Neonatology consult  -plan for BPP weekly (ordered for Tuesday)  2) Preterm labor -continue latency antibiotics -Tocolysis with indomethacin x 72hr -currently no evidence of infection -UA and culture negative, GBS negative -should pt note signs of labor, plan to start Magnesium for CP prophylaxis  3) Gestational DM -appreciate diabetic coordinator consult- started on Novolog 0-14 tid for meal coverage -accuchecks fasting and 2hr postprandial -gestational diabetic diet ordered -plan to closely monitor  4) Maternal care -Tylenol prn pain -Bedrest with bathroom privileges -ok for wheelchair rides and shower

## 2019-11-30 NOTE — Plan of Care (Signed)
  Problem: Clinical Measurements: Goal: Ability to maintain clinical measurements within normal limits will improve Outcome: Progressing Goal: Will remain free from infection Outcome: Progressing   

## 2019-12-01 LAB — GLUCOSE, CAPILLARY
Glucose-Capillary: 78 mg/dL (ref 70–99)
Glucose-Capillary: 95 mg/dL (ref 70–99)
Glucose-Capillary: 87 mg/dL (ref 70–99)
Glucose-Capillary: 115 mg/dL — ABNORMAL HIGH (ref 70–99)
Glucose-Capillary: 74 mg/dL (ref 70–99)

## 2019-12-01 NOTE — Progress Notes (Signed)
HD #5 PPROM at 30 wks   S: Pt reports constipation has resolved. She was able to have a bowel movement after dulcolax suppository. Small amount of LOF. No contractions no vaginal bleeding. +FM  O:  Vitals:   11/30/19 2236 12/01/19 0633 12/01/19 0636 12/01/19 0807  BP: (!) 100/58 (!) 98/54 (!) 104/58 (!) 98/59  Pulse: 92 91 (!) 107 90  Resp: 18  18 18   Temp: 99.1 F (37.3 C)  98.4 F (36.9 C) 98.5 F (36.9 C)  TempSrc: Oral  Oral Oral  SpO2: 97%  97% 95%  Weight:      Height:       General NAD Abdomen Gravid nontender  Ext no edema no calf tenderness   FHT NST for AM pending. NST for last night reactive   on 3/24: vertex/ right lateral placenta/ AFI 12.7/ EFW 3#8oz  A/P:29 y.o.G1P0@ [redacted]w[redacted]d EGA who presents forPPROM  1)FWB:Cat. I - continue fetal monitoring q shift x [redacted]w[redacted]d -BMZcompleted 3/24-3/25 -plan for rescue BMZ if pt goes into labor 7 days after steroid complete -s/p MFM and Neonatology consult  -plan for BPP weekly (ordered for Tuesday)  2) Preterm labor -continue latency antibiotics -Tocolysis with indomethacin x 72hr -currently no evidence of infection -UA and culturenegative,GBS negative -should pt note signs of labor, plan to start Magnesium for CP prophylaxis  3) Gestational DM -appreciatediabetic coordinatorconsult- started on Novolog 0-14 tid for meal coverage -accuchecks fasting and 2hr postprandial -gestational diabetic diet ordered -plan to closely monitor  4) Maternal care -Tylenol prn pain -Bedrest with bathroom privileges -ok for wheelchair rides and shower

## 2019-12-01 NOTE — Plan of Care (Signed)
  Problem: Clinical Measurements: Goal: Ability to maintain clinical measurements within normal limits will improve Outcome: Progressing Goal: Respiratory complications will improve Outcome: Progressing   

## 2019-12-02 ENCOUNTER — Inpatient Hospital Stay (HOSPITAL_BASED_OUTPATIENT_CLINIC_OR_DEPARTMENT_OTHER): Payer: Medicaid Other

## 2019-12-02 DIAGNOSIS — Z3A30 30 weeks gestation of pregnancy: Secondary | ICD-10-CM | POA: Diagnosis not present

## 2019-12-02 DIAGNOSIS — O42913 Preterm premature rupture of membranes, unspecified as to length of time between rupture and onset of labor, third trimester: Secondary | ICD-10-CM | POA: Diagnosis not present

## 2019-12-02 DIAGNOSIS — O24313 Unspecified pre-existing diabetes mellitus in pregnancy, third trimester: Secondary | ICD-10-CM

## 2019-12-02 LAB — GLUCOSE, CAPILLARY
Glucose-Capillary: 73 mg/dL (ref 70–99)
Glucose-Capillary: 85 mg/dL (ref 70–99)
Glucose-Capillary: 80 mg/dL (ref 70–99)
Glucose-Capillary: 129 mg/dL — ABNORMAL HIGH (ref 70–99)
Glucose-Capillary: 96 mg/dL (ref 70–99)

## 2019-12-02 MED ORDER — DOCUSATE SODIUM 100 MG PO CAPS: 100.0000 mg | ORAL_CAPSULE | Freq: Every day | ORAL | Status: DC | PRN

## 2019-12-02 MED ORDER — INSULIN ASPART 100 UNIT/ML ~~LOC~~ SOLN
0.0000 [IU] | Freq: Three times a day (TID) | SUBCUTANEOUS | Status: DC
  Administered 2019-12-02 – 2019-12-07 (×4): 2 [IU] via SUBCUTANEOUS

## 2019-12-02 MED ORDER — POLYETHYLENE GLYCOL 3350 17 G PO PACK
17.0000 g | PACK | Freq: Every day | ORAL | Status: DC
  Administered 2019-12-03 – 2019-12-08 (×6): 17 g via ORAL
  Filled 2019-12-02 (×7): qty 1

## 2019-12-02 NOTE — Progress Notes (Signed)
BPP 6/8 @ 9 am NST@ 1900 accelerations (15x15), moderate variability, variable x 1.  BPP 8/10.

## 2019-12-02 NOTE — Progress Notes (Addendum)
HD #6 PPROM at 30 5/7 wks   S: Pt reports constipation 2 days ago when she got Dulcolax.  It took her 2 hours to have BM but it was soft.  She has not had a BM since.  +LOF, pt reported fluid increased after BM.  no blood.  +FM.  Pt reports she is wearing SCDs throughout the day.  O:  Vitals:   12/01/19 2246 12/02/19 0637 12/02/19 0909 12/02/19 1155  BP: (!) 94/57 (!) 98/55 99/67 102/74  Pulse: 92 85 (!) 101 85  Resp: 18 18 18 18   Temp: 98.1 F (36.7 C) 98.2 F (36.8 C) 98.3 F (36.8 C) 98.3 F (36.8 C)  TempSrc: Oral Oral Oral Oral  SpO2: 100% 99% 99% 100%  Weight:      Height:        CBG (last 3)  Recent Labs    12/01/19 2237 12/02/19 0634 12/02/19 0911  GLUCAP 74 73 85    General NAD Abdomen Gravid nontender  Ext no edema no calf tenderness   NSTs:  120s, moderate variability, accelerations present. 1700 tracing- decreased variability initially but accelerations present.  2-3 subtle decelerations in the last 24 hours.  Tracing is overall reassuring.  12/04/19 on 3/24: vertex/ right lateral placenta/ AFI 12.7/ EFW 3#8oz  A/P:29 y.o.G1P0@ [redacted]w[redacted]d EGA   PPROM-stable.  No s/sxs of chorioamnionitis.  1)FWB:Cat. I - continue fetal monitoring q shift x [redacted]w[redacted]d -BMZcompleted 3/24-3/25 -plan for rescue BMZ if pt goes into labor 7 days after steroid complete -s/p MFM and Neonatology consult  -plan for BPP weekly (ordered for Tuesday. 12/04/10)  2) Preterm labor -continue latency antibiotics -S/p Indomethacin x 72hrs -currently no evidence of infection -UA and culturenegative,GBS negative -should pt note signs of labor, plan to start Magnesium for CP prophylaxis  3) Gestational DM -On Novolog 0-14 tid for meal coverage.  Blood sugar is normal. Adjust SSI to No insulin if CBG <120. -accuchecks fasting and 2hr postprandial -gestational diabetic diet ordered.  Pt getting African food occasionally from home. -plan to closely monitor  5)Constipation - Start Miralax  daily tomorrow.  Discussed importance of soft stools, no straining. -Change Colace to prn.  6)DVT prophylaxis. -Discussed importance of wearing SCDs.   4) Maternal care -Tylenol prn pain -Bedrest with bathroom privileges -ok for wheelchair rides and shower

## 2019-12-03 LAB — GLUCOSE, CAPILLARY
Glucose-Capillary: 78 mg/dL (ref 70–99)
Glucose-Capillary: 79 mg/dL (ref 70–99)
Glucose-Capillary: 101 mg/dL — ABNORMAL HIGH (ref 70–99)
Glucose-Capillary: 139 mg/dL — ABNORMAL HIGH (ref 70–99)

## 2019-12-03 LAB — TYPE AND SCREEN
ABO/RH(D): B POS
Antibody Screen: NEGATIVE

## 2019-12-03 NOTE — Progress Notes (Signed)
HD #7 PPROM at 30 6/7 wks   S: Reports no acute complaints overnight.  No F/C/CP/SOB.  Still noting LOF, no vaginal bleeding, +FM.  Denies regular contractions.  Did not report issue with BMs today.   O:  Vitals:   12/02/19 1709 12/02/19 1935 12/02/19 2227 12/03/19 0637  BP: 102/67 102/60 (!) 97/57 (!) 86/47  Pulse: 89 (!) 104 86 93  Resp: 16     Temp: 98.2 F (36.8 C) 98.3 F (36.8 C)    TempSrc: Oral Oral    SpO2: 100% 100%    Weight:      Height:        CBG (last 3)  Recent Labs    12/02/19 1934 12/02/19 2221 12/03/19 0636  GLUCAP 129* 96 78    General NAD CV: RRR Lungs: CTAB Abdomen Gravid nontender  Ext no edema no calf tenderness   FHT: 140, moderate variability, +accels, no decels  [place] on 3/24: vertex/ right lateral placenta/ AFI 12.7/ EFW 3#8oz  A/P:29 y.o.G1P0@ [redacted]w[redacted]d EGA   PPROM-stable.  No s/sxs of chorioamnionitis.  1)FWB:Cat. I - continue fetal monitoring q shift x -BMZcompleted 3/24-3/25 -plan for rescue BMZ if pt goes into labor 7 days after steroid complete -s/p MFM and Neonatology consult  -last BPP with NST 8/10 (no fetal breathing)  2) Preterm labor -on latency antibiotics -S/p tocolysis -currently no evidence of infection -UA and culturenegative,GBS negative -should pt note signs of labor, plan to start Magnesium for CP prophylaxis  3) Gestational DM -On Novolog 0-14 tid for meal coverage.  Blood sugar is normal. Adjust SSI to No insulin if CBG <120. -accuchecks fasting and 2hr postprandial -gestational diabetic diet ordered.  Pt getting African food occasionally from home. -plan to closely monitor  5)Constipation - Miralax daily  Discussed importance of soft stools, no straining. -Change Colace to prn.  6)DVT prophylaxis. -SCDs while in bed    4) Maternal care -Tylenol prn pain -Bedrest with bathroom privileges -ok for wheelchair rides and shower         Myna Hidalgo, DO [phone]  (cell) [phone] (office)

## 2019-12-04 LAB — GLUCOSE, CAPILLARY
Glucose-Capillary: 88 mg/dL (ref 70–99)
Glucose-Capillary: 89 mg/dL (ref 70–99)
Glucose-Capillary: 78 mg/dL (ref 70–99)
Glucose-Capillary: 84 mg/dL (ref 70–99)
Glucose-Capillary: 122 mg/dL — ABNORMAL HIGH (ref 70–99)
Glucose-Capillary: 84 mg/dL (ref 70–99)

## 2019-12-04 NOTE — Progress Notes (Signed)
HD #8 PPROM now [redacted]w[redacted]d  S: Reports no acute complaints overnight.  No F/C/CP/SOB.  Still noting LOF, no vaginal bleeding, +FM.  Denies regular contractions.  Resting comfortably in bed  O:  Vitals:   12/03/19 1541 12/03/19 2100 12/03/19 2327 12/04/19 0619  BP: (!) 101/55 (!) 104/59 100/61 94/60  Pulse: 99 (!) 101 (!) 102 86  Resp: 18 18 18 18   Temp: 98.5 F (36.9 C) 98.2 F (36.8 C) 98 F (36.7 C) 98.2 F (36.8 C)  TempSrc: Oral Oral Oral Oral  SpO2: 99% 98% 99% 99%  Weight:      Height:        CBG (last 3)  Recent Labs    12/03/19 2057 12/03/19 2325 12/04/19 0623  GLUCAP 139* 88 89    General NAD CV: RRR Lungs: CTAB Abdomen Gravid nontender  Ext no edema no calf tenderness   FHT: 150, moderate variability, no accels, no decels  12/06/19 on 3/24: vertex/ right lateral placenta/ AFI 12.7/ EFW 3#8oz  A/P:29 y.o.G1P0@ [redacted]w[redacted]d EGA   PPROM-stable.  No s/sxs of chorioamnionitis.  1)FWB:Cat. I - continue fetal monitoring q shift x [redacted]w[redacted]d -BMZcompleted 3/24-3/25 -plan for rescue BMZ if pt goes into labor 7 days after steroid complete -s/p MFM and Neonatology consult  -last BPP with NST 8/10 (no fetal breathing)  2) Preterm labor -on latency antibiotics -S/p tocolysis -currently no evidence of infection -UA and culturenegative,GBS negative -should pt note signs of labor, plan to start Magnesium for CP prophylaxis  3) Gestational DM -On Novolog 0-14 tid for meal coverage.  Blood sugars mostly within normal range. Adjust SSI to No insulin if CBG <120. -accuchecks fasting and 2hr postprandial -gestational diabetic diet ordered.  Pt getting African food occasionally from home. -plan to closely monitor  5)Constipation - Miralax daily  -Change Colace to prn.  6)DVT prophylaxis. -SCDs while in bed    4) Maternal care -Tylenol prn pain -Bedrest with bathroom privileges -ok for wheelchair rides and shower         01-14-1988, DO [phone]  (cell) [phone] (office)

## 2019-12-04 NOTE — Progress Notes (Signed)
Initial Nutrition Assessment  DOCUMENTATION CODES:   Not applicable  INTERVENTION:  Carbohydrate modified gestational diabetic diet Double protein portions upon request  NUTRITION DIAGNOSIS:   Increased nutrient needs related to (pregnancy and fetal growth requirements) as evidenced by (31 weeks IUP).  GOAL:  Patient will meet greater than or equal to 90% of their needs   MONITOR:  Labs  REASON FOR ASSESSMENT:  LOS, Antenatal    ASSESSMENT:  Now 31 weeks, adm with PROM. GDM. no pregravid wt avaiable in EPIC. Appetite good. To remin in house until delivery   Diet Order:   Diet Order            Diet gestational carb mod Room service appropriate? Yes; Fluid consistency: Thin  Diet effective now              EDUCATION NEEDS:   Education needs have been addressed  Skin:  Skin Assessment: Reviewed RN Assessment  Height:   Ht Readings from Last 1 Encounters:  11/27/19 5\' 6"  (1.676 m)    Weight:   Wt Readings from Last 1 Encounters:  11/27/19 90.4 kg    Ideal Body Weight:   130 lbs  BMI:  Body mass index is 32.18 kg/m.  Estimated Nutritional Needs:   Kcal:  2100-2300  Protein:  95 -105 g  Fluid:  2.4 L    11/29/19 M.M LDN Neonatal Nutrition Support Specialist/RD III

## 2019-12-04 NOTE — Progress Notes (Signed)
Pt refused AM EFM. States that she needs to rest.

## 2019-12-05 LAB — GLUCOSE, CAPILLARY
Glucose-Capillary: 95 mg/dL (ref 70–99)
Glucose-Capillary: 102 mg/dL — ABNORMAL HIGH (ref 70–99)
Glucose-Capillary: 104 mg/dL — ABNORMAL HIGH (ref 70–99)
Glucose-Capillary: 120 mg/dL — ABNORMAL HIGH (ref 70–99)
Glucose-Capillary: 102 mg/dL — ABNORMAL HIGH (ref 70–99)

## 2019-12-05 MED ORDER — LACTATED RINGERS IV BOLUS
500.0000 mL | Freq: Once | INTRAVENOUS | Status: AC
  Administered 2019-12-05: 08:00:00 500 mL via INTRAVENOUS

## 2019-12-05 MED ORDER — LACTATED RINGERS IV SOLN: INTRAVENOUS | Status: DC

## 2019-12-05 NOTE — Plan of Care (Signed)
  Problem: Education: Goal: Knowledge of disease or condition will improve Outcome: Completed/Met Goal: Knowledge of the prescribed therapeutic regimen will improve Outcome: Completed/Met   Problem: Clinical Measurements: Goal: Cardiovascular complication will be avoided Outcome: Completed/Met   Problem: Coping: Goal: Level of anxiety will decrease Outcome: Completed/Met

## 2019-12-05 NOTE — Progress Notes (Signed)
HD #9 PPROM now [redacted]w[redacted]d  S: Pt notes some difficulty sleeping overnight- intermittent low pelvic pain/cramping.  Notes the pain every or so, not painful, more like "menstrual cramps.  No other acute complaints. No F/C/CP/SOB.  Still noting LOF, no vaginal bleeding, +FM.  Previously had some issues with constipation- last BM on Tuesday- no issues currently  O:  Vitals:   12/04/19 1945 12/04/19 2320 12/05/19 0559 12/05/19 0600  BP: 107/61 108/73 102/63   Pulse: (!) 101 90 (!) 115   Resp: 18 20 18    Temp: 98.5 F (36.9 C) 98.6 F (37 C) 98.2 F (36.8 C)   TempSrc: Oral Oral Oral   SpO2: 100% 100% 100% 98%  Weight:      Height:        CBG (last 3)  Recent Labs    12/04/19 1953 12/04/19 2155 12/05/19 0558  GLUCAP 122* 84 95    General NAD CV: RRR Lungs: CTAB Abdomen Gravid nontender  Ext no edema no calf tenderness   FHT: 140, moderate variability, +10x10 accels, no decels   02/04/20 on 3/24: vertex/ right lateral placenta/ AFI 12.7/ EFW 3#8oz  A/P:29 y.o.G1P0@ [redacted]w[redacted]d EGA   PPROM-stable.  No s/sxs of chorioamnionitis.  -Pt to be placed on monitor this am and given 500cc fluid bolus.    1)FWB:Cat. I - fetal monitoring q shift x [redacted]w[redacted]d -BMZcompleted 3/24-3/25 -plan for rescue BMZ if pt goes into labor 7 days after steroid complete -s/p MFM and Neonatology consult  -last BPP with NST 8/10 (no fetal breathing)  2) Preterm labor -on latency antibiotics -S/p tocolysis -currently no evidence of infection -UA and culturenegative,GBS negative -should pt note signs of labor, plan to start Magnesium for CP prophylaxis  3) Gestational DM -On Novolog 0-14 tid for meal coverage.  Blood sugars within normal range. Adjust SSI to No insulin if CBG <120. -accuchecks fasting and 2hr postprandial -gestational diabetic diet ordered.  Pt getting African food occasionally from home. -plan to closely monitor  5)Constipation - Miralax daily  -Change Colace to  prn.  6)DVT prophylaxis. -SCDs while in bed    4) Maternal care -Tylenol prn pain -Bedrest with bathroom privileges -ok for wheelchair rides and shower         01-14-1988, DO [phone] (cell) [phone] (office)

## 2019-12-06 LAB — GLUCOSE, CAPILLARY
Glucose-Capillary: 91 mg/dL (ref 70–99)
Glucose-Capillary: 95 mg/dL (ref 70–99)
Glucose-Capillary: 82 mg/dL (ref 70–99)
Glucose-Capillary: 117 mg/dL — ABNORMAL HIGH (ref 70–99)
Glucose-Capillary: 89 mg/dL (ref 70–99)

## 2019-12-06 LAB — TYPE AND SCREEN
ABO/RH(D): B POS
Antibody Screen: NEGATIVE

## 2019-12-06 NOTE — Progress Notes (Addendum)
HD #10 PPROM now [redacted]w[redacted]d  S: Pt notes occ and random menstral sweep like cramp, otherwise endorses feeling stable, positive for gush over fluid over the night, non now, denies vaginal bleeding, endorses +FM.   No other acute complaints. No F/C/CP/SOB.  Still noting LOF, no vaginal bleeding, +FM.  Previously had some issues with constipation- last BM on Tuesday- no issues currently. Pt aware of plan of care.   O:  Vitals:   12/05/19 2316 12/06/19 0532 12/06/19 0743 12/06/19 0805  BP: (!) 97/57 98/63 105/65 107/63  Pulse: 87 92 91 90  Resp: 18 18 16 17   Temp: 98.3 F (36.8 C) 98.1 F (36.7 C) 98.4 F (36.9 C) 99.4 F (37.4 C)  TempSrc: Oral Oral Oral Oral  SpO2: 97% 100% 100% 98%  Weight:      Height:        CBG (last 3)  Recent Labs    12/05/19 2026 12/05/19 2227 12/06/19 0616  GLUCAP 120* 102* 91    General NAD CV: RRR Lungs: CTAB Abdomen Gravid nontender  Ext no edema no calf tenderness   FHT: 140, moderate variability, +10x10 accels, no decels   02/05/20 on 3/24: vertex/ right lateral placenta/ AFI 12.7/ EFW 3#8oz  A/P:29 y.o.G1P0@ [redacted]w[redacted]d EGA   PPROM-stable.  No s/sxs of chorioamnionitis.    1)FWB:Cat. I - fetal monitoring q shift x [redacted]w[redacted]d -BMZcompleted 3/24-3/25 -plan for rescue BMZ if pt goes into labor 7 days after steroid complete -s/p MFM and Neonatology consult  -last BPP with NST 8/10 (no fetal breathing)  2) Preterm labor -on latency antibiotics -S/p tocolysis -currently no evidence of infection -UA and culturenegative,GBS negative -should pt note signs of labor, plan to start Magnesium for CP prophylaxis  3) Gestational DM -On Novolog 0-14 tid for meal coverage.  Blood sugars within normal range. Adjust SSI to No insulin if CBG <120. -accuchecks fasting and 2hr postprandial -gestational diabetic diet ordered.  Pt getting African food occasionally from home. -plan to closely monitor  5)Constipation - Miralax daily  -Change Colace to  prn.  6)DVT prophylaxis. -SCDs while in bed    4) Maternal care -Tylenol prn pain -Bedrest with bathroom privileges -ok for wheelchair rides and shower  5) GBS: Negative.           DR 01-14-1988 to be updated on pt status.   [place], [place], [place] 12/06/19 9:48 AM

## 2019-12-07 ENCOUNTER — Encounter (HOSPITAL_COMMUNITY): Payer: Self-pay | Admitting: Obstetrics & Gynecology

## 2019-12-07 LAB — GLUCOSE, CAPILLARY
Glucose-Capillary: 86 mg/dL (ref 70–99)
Glucose-Capillary: 98 mg/dL (ref 70–99)
Glucose-Capillary: 82 mg/dL (ref 70–99)
Glucose-Capillary: 129 mg/dL — ABNORMAL HIGH (ref 70–99)
Glucose-Capillary: 110 mg/dL — ABNORMAL HIGH (ref 70–99)

## 2019-12-07 MED ORDER — SODIUM CHLORIDE 0.9% FLUSH
3.0000 mL | Freq: Two times a day (BID) | INTRAVENOUS | Status: DC
  Administered 2019-12-07 – 2019-12-08 (×4): 3 mL via INTRAVENOUS

## 2019-12-07 NOTE — Progress Notes (Addendum)
Patient ID: Andrea Escobar, female   DOB: March 11, 1991, 29 y.o.   MRN: [id number]   HD# 10 PPROM now [redacted]w[redacted]d   S: Resting in chair by window and watching videos on her phone. Reports good FM, notes more fluid loss since yesterday, mild cramping during night, rare this am.   O: BP 96/66 (BP Location: Left Arm)   Pulse (!) 101   Temp 98.4 F (36.9 C) (Oral)   Resp 18   Ht 5\' 6"  (1.676 m)   Wt 90.4 kg   SpO2 100%   BMI 32.18 kg/m    : 145 bpm, Variability: Good {> 6 bpm), Accelerations: Non-reactive but appropriate for gestational age and Decelerations: Absent Toco: none WVP:[id number]: Vertex(bedside WNI:[id number]) Exam by:: [email or web address], DO  A/P- 29 y.o. G1P0 at [redacted]w[redacted]d admitted withPPROM-stable.   No s/sxs of chorioamnionitis.  1)FWB:Cat. I - fetal monitoring q shift x [redacted]w[redacted]d -BMZcompleted 3/24-3/25 -plan for rescue BMZ if pt goes into labor 7 days after steroid complete -s/p MFM and Neonatology consult  -last BPP with NST 8/10 (no fetal breathing)  2) Preterm labor -on latency antibiotics -S/p tocolysis -currently no evidence of infection -UA and culturenegative,GBS negative -should pt note signs of labor, plan to start Magnesium for CP prophylaxis  3) Gestational DM -On Novolog 0-14 tid for meal coverage.  Blood sugars within normal range. Adjust SSI to No insulin if CBG <120. -accuchecks fasting and 2hr postprandial -gestational diabetic diet ordered.  Pt getting African food occasionally from home. -plan to closely monitor  5)Constipation - Miralax daily  -Colace prn.  6)DVT prophylaxis. -SCDs while in bed    4) Maternal care -Tylenol prn pain -Bedrest with bathroom privileges -ok for wheelchair rides and shower  Update to Dr. 01-14-1988, CNM, MSN 12/07/2019, 11:51 AM

## 2019-12-08 LAB — GLUCOSE, CAPILLARY
Glucose-Capillary: 81 mg/dL (ref 70–99)
Glucose-Capillary: 90 mg/dL (ref 70–99)
Glucose-Capillary: 75 mg/dL (ref 70–99)
Glucose-Capillary: 96 mg/dL (ref 70–99)
Glucose-Capillary: 75 mg/dL (ref 70–99)

## 2019-12-08 NOTE — Progress Notes (Signed)
HD #11 PPROM now [redacted]w[redacted]d  S: Pt notes occ and random cxt and small gushes of fluid over the night, non now, denies vaginal bleeding, endorses +FM. No other acute complaints. No F/C/CP/SOB.  Still noting LOF, no vaginal bleeding, +FM.  Previously had some issues with constipation- no issues currently. Pt aware of plan of care. Pt endorses Mood being stable.   O:  Vitals:   12/08/19 0554 12/08/19 0823 12/08/19 0825 12/08/19 1147  BP: 104/66 (!) 100/57  110/74  Pulse: 92 97  97  Resp: 18 18  18   Temp: 98.2 F (36.8 C) 98.5 F (36.9 C)  98.5 F (36.9 C)  TempSrc: Oral Oral  Oral  SpO2: 98% 97% 97% 99%  Weight:      Height:        CBG (last 3)  Recent Labs    12/07/19 2220 12/08/19 0556 12/08/19 1029  GLUCAP 110* 81 90    General NAD CV: RRR Lungs: CTAB Abdomen Gravid nontender  Ext no edema no calf tenderness   FHT: 140, moderate variability, +10x10 accels, no decels   02/07/20 on 3/24: vertex/ right lateral placenta/ AFI 12.7/ EFW 3#8oz  A/P:29 y.o.G1P0@ [redacted]w[redacted]d EGA LOS# 11  PPROM-stable.  No s/sxs of chorioamnionitis.    1)FWB:Cat. I - fetal monitoring q shift x [redacted]w[redacted]d -BMZcompleted 3/24-3/25 -plan for rescue BMZ if pt goes into labor 7 days after steroid complete -s/p MFM and Neonatology consult  -last BPP with NST 8/10 (no fetal breathing)  2) Preterm labor -s/p latency antibiotics -S/p tocolysis -currently no evidence of infection -UA and culturenegative,GBS negative -should pt note signs of labor, plan to start Magnesium for CP prophylaxis  3) Gestational DM -On Novolog 0-14 tid for meal coverage.  Blood sugars within normal range. Adjust SSI to No insulin if CBG <120. -accuchecks fasting and 2hr postprandial -gestational diabetic diet ordered.  Pt getting African food occasionally from home. -plan to closely monitor  5)Constipation - Miralax daily  -Change Colace to prn.  6)DVT prophylaxis. -SCDs while in bed    4) Maternal care -Tylenol  prn pain -Bedrest with bathroom privileges -ok for wheelchair rides and shower  5) GBS: Negative.           DR 01-14-1988 to be updated on pt status.   [place], [place], [place] 12/08/19 1:45 PM

## 2019-12-09 LAB — CBC
HCT: 31.8 % — ABNORMAL LOW (ref 36.0–46.0)
Hemoglobin: 9.9 g/dL — ABNORMAL LOW (ref 12.0–15.0)
MCH: 23.9 pg — ABNORMAL LOW (ref 26.0–34.0)
MCHC: 31.1 g/dL (ref 30.0–36.0)
MCV: 76.8 fL — ABNORMAL LOW (ref 80.0–100.0)
Platelets: 199 10*3/uL (ref 150–400)
RBC: 4.14 MIL/uL (ref 3.87–5.11)
RDW: 17.1 % — ABNORMAL HIGH (ref 11.5–15.5)
WBC: 13.3 10*3/uL — ABNORMAL HIGH (ref 4.0–10.5)
nRBC: 0 % (ref 0.0–0.2)

## 2019-12-09 LAB — GLUCOSE, CAPILLARY
Glucose-Capillary: 70 mg/dL (ref 70–99)
Glucose-Capillary: 81 mg/dL (ref 70–99)
Glucose-Capillary: 141 mg/dL — ABNORMAL HIGH (ref 70–99)
Glucose-Capillary: 103 mg/dL — ABNORMAL HIGH (ref 70–99)

## 2019-12-09 LAB — TYPE AND SCREEN
ABO/RH(D): B POS
Antibody Screen: NEGATIVE

## 2019-12-09 LAB — HIV ANTIBODY (ROUTINE TESTING W REFLEX): HIV Screen 4th Generation wRfx: NONREACTIVE

## 2019-12-09 MED ORDER — LACTATED RINGERS IV BOLUS: 500.0000 mL | Freq: Once | INTRAVENOUS | Status: DC

## 2019-12-09 MED ORDER — ONDANSETRON HCL 4 MG/2ML IJ SOLN: 4.0000 mg | Freq: Four times a day (QID) | INTRAMUSCULAR | Status: DC | PRN

## 2019-12-09 MED ORDER — LACTATED RINGERS IV SOLN: 500.0000 mL | INTRAVENOUS | Status: DC | PRN

## 2019-12-09 MED ORDER — MAGNESIUM SULFATE 40 GM/1000ML IV SOLN
INTRAVENOUS | Status: AC
  Filled 2019-12-09: qty 1000

## 2019-12-09 MED ORDER — OXYTOCIN BOLUS FROM INFUSION: 500.0000 mL | Freq: Once | INTRAVENOUS | Status: DC

## 2019-12-09 MED ORDER — ACETAMINOPHEN 325 MG PO TABS: 650.0000 mg | ORAL_TABLET | ORAL | Status: DC | PRN

## 2019-12-09 MED ORDER — BETAMETHASONE SOD PHOS & ACET 6 (3-3) MG/ML IJ SUSP
12.0000 mg | Freq: Once | INTRAMUSCULAR | Status: AC
  Administered 2019-12-10: 12 mg via INTRAMUSCULAR
  Filled 2019-12-09: qty 5

## 2019-12-09 MED ORDER — LACTATED RINGERS IV SOLN: INTRAVENOUS | Status: DC

## 2019-12-09 MED ORDER — BETAMETHASONE SOD PHOS & ACET 6 (3-3) MG/ML IJ SUSP
12.0000 mg | Freq: Two times a day (BID) | INTRAMUSCULAR | Status: AC
  Administered 2019-12-09: 11:00:00 12 mg via INTRAMUSCULAR
  Filled 2019-12-09: qty 5

## 2019-12-09 MED ORDER — MAGNESIUM SULFATE BOLUS VIA INFUSION
4.0000 g | Freq: Once | INTRAVENOUS | Status: AC
  Administered 2019-12-09: 09:00:00 4 g via INTRAVENOUS
  Filled 2019-12-09: qty 1000

## 2019-12-09 MED ORDER — SOD CITRATE-CITRIC ACID 500-334 MG/5ML PO SOLN: 30.0000 mL | ORAL | Status: DC | PRN

## 2019-12-09 MED ORDER — OXYTOCIN 40 UNITS IN NORMAL SALINE INFUSION - SIMPLE MED: 2.5000 [IU]/h | INTRAVENOUS | Status: DC

## 2019-12-09 MED ORDER — LIDOCAINE HCL (PF) 1 % IJ SOLN: 30.0000 mL | INTRAMUSCULAR | Status: DC | PRN

## 2019-12-09 MED ORDER — OXYCODONE-ACETAMINOPHEN 5-325 MG PO TABS: 2.0000 | ORAL_TABLET | ORAL | Status: DC | PRN

## 2019-12-09 MED ORDER — OXYCODONE-ACETAMINOPHEN 5-325 MG PO TABS: 1.0000 | ORAL_TABLET | ORAL | Status: DC | PRN

## 2019-12-09 MED ORDER — MAGNESIUM SULFATE 40 GM/1000ML IV SOLN
2.0000 g/h | INTRAVENOUS | Status: DC
  Administered 2019-12-10: 03:00:00 2 g/h via INTRAVENOUS
  Filled 2019-12-09: qty 1000

## 2019-12-09 NOTE — Progress Notes (Signed)
Pt called out with c/o intermittent lower abdominal cramping. She stated the cramping felt like "period cramps".   I applied the toco to further assess if any ctx's could be taking place. Upon palpation could feel FM in the lower abdominal area. Pt pain noted at the same time of the FM. No ctx's noted on the monitor at this time.   Pt's temperature is 99 F, and LOF is clear and yellow at times. She felt as though at times during the night she would feel a heavier flow of fluid, but stated she did not saturate any pads. Explained to patient that this was normal, as when she lays down fluid can pool.   Gave pt tylenol upon her request for pain management. She stated she could not tell if it worked for her due to the pain coming at irregular times.  Instructed pt to call if she noticed any changes in LOF or pain.

## 2019-12-09 NOTE — Progress Notes (Signed)
Andrea Escobar is a 29 y.o. G1P0 at [redacted]w[redacted]d   Subjective: Pt is comfortable.  Denies pain with contractions.  Denies pressure or bloody show  Objective: BP 106/63   Pulse (!) 105   Temp 99 F (37.2 C)   Resp 18   Ht 5\' 6"  (1.676 m)   Wt 90.4 kg   SpO2 97%   BMI 32.17 kg/m  I/O last 3 completed shifts: In: 667.9 [I.V.:667.9] Out: -  Total I/O In: 792.6 [P.O.:180; I.V.:612.6] Out: 1450 [Urine:1450]  FHT:  FHR: 130s bpm, variability: minimal ,  accelerations:  Present,  decelerations:  Absent UC:   irregular, every 10 minutes SVE: Cervix deferred.   Previous exam:  Dilation: 4.5 Effacement (%): 80 Station: -2 Exam by:: [email or web address], MD  Labs: Lab Results  Component Value Date   WBC 13.3 (H) 12/09/2019   HGB 9.9 (L) 12/09/2019   HCT 31.8 (L) 12/09/2019   MCV 76.8 (L) 12/09/2019   PLT 199 12/09/2019    Assessment / Plan: IUP @ 31 5/7 weeks PPROM x 12 days  Labor: Arrested likely due to magnesium.   Preeclampsia:  BP normal Fetal Wellbeing:  Category I, decrease variability due to Magnesium, which is for neuroprophylaxis.  S/p BMZ x 1,  Pain Control:  Epidural prn  I/D:  GBS negative, no s/sxs of chorioamnionitis Anticipated MOD:  NSVD  7/7 12/09/2019, 2:25 PM

## 2019-12-09 NOTE — Progress Notes (Signed)
Called by RN with c/o pt having cramping.  Per pt, they started about 9 hours ago.  RN could not pick up this morning around 5:30 am.  RN reported pt is having to breath through contractions. Magnesium bolus ordered but there was difficulty starting an IV. Upon my arrival, pt appeared comfortable, smiling.  When contraction started, she was breathing through it.    BP 104/63 (BP Location: Left Arm)   Pulse 98   Temp 98.4 F (36.9 C) (Oral)   Resp 17   Ht 5\' 6"  (1.676 m)   Wt 90.4 kg   SpO2 97%   BMI 32.18 kg/m    Gen:  NAD , nontender Dilation: 4.5 Effacement (%): 80 Station: -2 Presentation: Vertex Exam by:: [email or web address], MD   No FHTs on screen.  Bedside ultrasound:  Vertex  IUP @ 31 5/7 weeks H/o PPROM x 11 days, s/p Latency antibiotics and BMZ. Now in labor. Start Magnesium for neuroprophylaxis. Rescue steroids > 7 days since last dose. Pt desires epidural.

## 2019-12-09 NOTE — Progress Notes (Signed)
Pt is comfortable.  States she does not feel any contractions.  She thinks the medicine (ie Magnesium) is making her feel tired.  Pt would like to eat.  Pt has not eaten today.  She denies visual changes or SOB.  C/o intermittent lower back pain if in bed.  BP 105/65   Pulse (!) 102   Temp 99 F (37.2 C)   Resp 18   Ht 5\' 6"  (1.676 m)   Wt 90.4 kg   SpO2 97%   BMI 32.17 kg/m   Gen:  NAD, resting quietly in the chair. Abd:  No fundal tenderness Cervix: deferred. Neuro:  DTR 2+  EM:  130-140s, moderate variability, no declerations, accelerations present. Contraction q 8-10 min  A/P IUP @ 31 5/7 weeks PPROM x 12 days. S/p Latency abx.- No sxs of chorioamnionitis. PTL- contractions appear more regular but spaced out.  pt does not feel pain GBS negative Fetal well being-  Category I tracing.  Continue Magnesium x 12 hours for neuroprophylaxis if not in active labor.    Next dose of Rescue BMZ @ 2300. Allow light labor diet x 1 Discontinue magnesium at 12 hours if contractions spaced out and observe x 4 hours.  If no change in contraction pattern consider transfer back to antepartum.  If no active labor, consider second dose of BMZ @ 24 hours.  CCOB to cover at 7 pm-7 am.  Pt aware.

## 2019-12-10 LAB — GLUCOSE, CAPILLARY
Glucose-Capillary: 141 mg/dL — ABNORMAL HIGH (ref 70–99)
Glucose-Capillary: 97 mg/dL (ref 70–99)
Glucose-Capillary: 88 mg/dL (ref 70–99)
Glucose-Capillary: 98 mg/dL (ref 70–99)
Glucose-Capillary: 110 mg/dL — ABNORMAL HIGH (ref 70–99)
Glucose-Capillary: 135 mg/dL — ABNORMAL HIGH (ref 70–99)
Glucose-Capillary: 108 mg/dL — ABNORMAL HIGH (ref 70–99)

## 2019-12-10 LAB — RPR: RPR Ser Ql: NONREACTIVE

## 2019-12-10 MED ORDER — INSULIN ASPART 100 UNIT/ML ~~LOC~~ SOLN: 0.0000 [IU] | Freq: Three times a day (TID) | SUBCUTANEOUS | Status: DC

## 2019-12-10 MED ORDER — CALCIUM CARBONATE ANTACID 500 MG PO CHEW: 2.0000 | CHEWABLE_TABLET | ORAL | Status: DC | PRN

## 2019-12-10 MED ORDER — INSULIN ASPART 100 UNIT/ML ~~LOC~~ SOLN
0.0000 [IU] | Freq: Three times a day (TID) | SUBCUTANEOUS | Status: DC
  Administered 2019-12-10 (×3): 1 [IU] via SUBCUTANEOUS

## 2019-12-10 MED ORDER — PRENATAL MULTIVITAMIN CH
1.0000 | ORAL_TABLET | Freq: Every day | ORAL | Status: DC
  Administered 2019-12-10: 11:00:00 1 via ORAL
  Filled 2019-12-10: qty 1

## 2019-12-10 MED ORDER — DOCUSATE SODIUM 100 MG PO CAPS
100.0000 mg | ORAL_CAPSULE | Freq: Every day | ORAL | Status: DC
  Administered 2019-12-10: 11:00:00 100 mg via ORAL
  Filled 2019-12-10: qty 1

## 2019-12-10 NOTE — Progress Notes (Signed)
She reports that she does not feel any more painful contractions this am.  No VB, +FM.  Still feeling small leakage of fluid.  Tolerating light diet.  No acute complaints this am  BP 113/64   Pulse 100   Temp 98.4 F (36.9 C) (Oral)   Resp 18   Ht 5\' 6"  (1.676 m)   Wt 90.4 kg   SpO2 97%   BMI 32.17 kg/m   Gen:  NAD CV: RRR Resp: normal rate and effort Abd:  No fundal tenderness Ext: no edema, no calf tenderness bilaterally  FHT: 140, moderate variability, +accels Contraction:occasional contraction SVE: 4.5/80/-2- unchanged  A/P: 29yo G1P0 @ 31 6/7 weeks PPROM x 13 days. S/p Latency abx.- No sxs of chorioamnionitis. PTL- no signs of further contractions or dilation GBS negative Fetal well being-  Category I tracing  -plan to discontinue Magnesium -s/p rescue dose of steriods  She has remained stable overnight with no signs of preterm labor or infection- plan to transfer to antepartum.  Will continue to watch closely with continuous monitoring for now  03-19-1968, DO [phone] (cell) [phone] (office)

## 2019-12-11 ENCOUNTER — Inpatient Hospital Stay (HOSPITAL_COMMUNITY): Payer: Medicaid Other | Admitting: Anesthesiology

## 2019-12-11 ENCOUNTER — Encounter (HOSPITAL_COMMUNITY): Payer: Self-pay | Admitting: Obstetrics & Gynecology

## 2019-12-11 ENCOUNTER — Other Ambulatory Visit: Payer: Self-pay

## 2019-12-11 LAB — CBC
HCT: 31.2 % — ABNORMAL LOW (ref 36.0–46.0)
Hemoglobin: 9.8 g/dL — ABNORMAL LOW (ref 12.0–15.0)
MCH: 23.6 pg — ABNORMAL LOW (ref 26.0–34.0)
MCHC: 31.4 g/dL (ref 30.0–36.0)
MCV: 75.2 fL — ABNORMAL LOW (ref 80.0–100.0)
Platelets: 197 10*3/uL (ref 150–400)
RBC: 4.15 MIL/uL (ref 3.87–5.11)
RDW: 17.2 % — ABNORMAL HIGH (ref 11.5–15.5)
WBC: 16.9 10*3/uL — ABNORMAL HIGH (ref 4.0–10.5)
nRBC: 0.1 % (ref 0.0–0.2)

## 2019-12-11 MED ORDER — FLEET ENEMA 7-19 GM/118ML RE ENEM: 1.0000 | ENEMA | RECTAL | Status: DC | PRN

## 2019-12-11 MED ORDER — SOD CITRATE-CITRIC ACID 500-334 MG/5ML PO SOLN: 30.0000 mL | ORAL | Status: DC | PRN

## 2019-12-11 MED ORDER — LIDOCAINE HCL (PF) 1 % IJ SOLN
INTRAMUSCULAR | Status: DC | PRN
  Administered 2019-12-11 (×2): 5 mL via EPIDURAL

## 2019-12-11 MED ORDER — LIDOCAINE HCL (PF) 1 % IJ SOLN: 30.0000 mL | INTRAMUSCULAR | Status: DC | PRN

## 2019-12-11 MED ORDER — DIPHENHYDRAMINE HCL 50 MG/ML IJ SOLN: 12.5000 mg | INTRAMUSCULAR | Status: DC | PRN

## 2019-12-11 MED ORDER — PHENYLEPHRINE 40 MCG/ML (10ML) SYRINGE FOR IV PUSH (FOR BLOOD PRESSURE SUPPORT): 80.0000 ug | PREFILLED_SYRINGE | INTRAVENOUS | Status: DC | PRN

## 2019-12-11 MED ORDER — TETANUS-DIPHTH-ACELL PERTUSSIS 5-2.5-18.5 LF-MCG/0.5 IM SUSP: 0.5000 mL | Freq: Once | INTRAMUSCULAR | Status: DC

## 2019-12-11 MED ORDER — OXYCODONE-ACETAMINOPHEN 5-325 MG PO TABS: 2.0000 | ORAL_TABLET | ORAL | Status: DC | PRN

## 2019-12-11 MED ORDER — DIBUCAINE (PERIANAL) 1 % EX OINT: 1.0000 "application " | TOPICAL_OINTMENT | CUTANEOUS | Status: DC | PRN

## 2019-12-11 MED ORDER — ZOLPIDEM TARTRATE 5 MG PO TABS: 5.0000 mg | ORAL_TABLET | Freq: Every evening | ORAL | Status: DC | PRN

## 2019-12-11 MED ORDER — OXYCODONE-ACETAMINOPHEN 5-325 MG PO TABS: 1.0000 | ORAL_TABLET | ORAL | Status: DC | PRN

## 2019-12-11 MED ORDER — ONDANSETRON HCL 4 MG PO TABS: 4.0000 mg | ORAL_TABLET | ORAL | Status: DC | PRN

## 2019-12-11 MED ORDER — SIMETHICONE 80 MG PO CHEW: 80.0000 mg | CHEWABLE_TABLET | ORAL | Status: DC | PRN

## 2019-12-11 MED ORDER — ONDANSETRON HCL 4 MG/2ML IJ SOLN: 4.0000 mg | Freq: Four times a day (QID) | INTRAMUSCULAR | Status: DC | PRN

## 2019-12-11 MED ORDER — IBUPROFEN 600 MG PO TABS
600.0000 mg | ORAL_TABLET | Freq: Four times a day (QID) | ORAL | Status: DC
  Administered 2019-12-11 – 2019-12-13 (×9): 600 mg via ORAL
  Filled 2019-12-11 (×9): qty 1

## 2019-12-11 MED ORDER — FENTANYL-BUPIVACAINE-NACL 0.5-0.125-0.9 MG/250ML-% EP SOLN
12.0000 mL/h | EPIDURAL | Status: DC | PRN
  Filled 2019-12-11: qty 250

## 2019-12-11 MED ORDER — EPHEDRINE 5 MG/ML INJ: 10.0000 mg | INTRAVENOUS | Status: DC | PRN

## 2019-12-11 MED ORDER — DIPHENHYDRAMINE HCL 25 MG PO CAPS: 25.0000 mg | ORAL_CAPSULE | Freq: Four times a day (QID) | ORAL | Status: DC | PRN

## 2019-12-11 MED ORDER — BENZOCAINE-MENTHOL 20-0.5 % EX AERO
1.0000 "application " | INHALATION_SPRAY | CUTANEOUS | Status: DC | PRN
  Administered 2019-12-11: 1 via TOPICAL
  Filled 2019-12-11: qty 56

## 2019-12-11 MED ORDER — LACTATED RINGERS IV SOLN: 500.0000 mL | INTRAVENOUS | Status: DC | PRN

## 2019-12-11 MED ORDER — SODIUM CHLORIDE (PF) 0.9 % IJ SOLN
INTRAMUSCULAR | Status: DC | PRN
  Administered 2019-12-11: 12 mL/h via EPIDURAL

## 2019-12-11 MED ORDER — OXYTOCIN BOLUS FROM INFUSION
500.0000 mL | Freq: Once | INTRAVENOUS | Status: AC
  Administered 2019-12-11: 04:00:00 500 mL via INTRAVENOUS

## 2019-12-11 MED ORDER — ACETAMINOPHEN 325 MG PO TABS: 650.0000 mg | ORAL_TABLET | ORAL | Status: DC | PRN

## 2019-12-11 MED ORDER — SENNOSIDES-DOCUSATE SODIUM 8.6-50 MG PO TABS
2.0000 | ORAL_TABLET | ORAL | Status: DC
  Administered 2019-12-11 – 2019-12-12 (×2): 2 via ORAL
  Filled 2019-12-11 (×2): qty 2

## 2019-12-11 MED ORDER — CEFAZOLIN SODIUM-DEXTROSE 2-4 GM/100ML-% IV SOLN
2.0000 g | Freq: Once | INTRAVENOUS | Status: AC
  Administered 2019-12-11: 05:00:00 2 g via INTRAVENOUS
  Filled 2019-12-11: qty 100

## 2019-12-11 MED ORDER — COCONUT OIL OIL: 1.0000 "application " | TOPICAL_OIL | Status: DC | PRN

## 2019-12-11 MED ORDER — LACTATED RINGERS IV SOLN: INTRAVENOUS | Status: DC

## 2019-12-11 MED ORDER — OXYTOCIN 40 UNITS IN NORMAL SALINE INFUSION - SIMPLE MED
2.5000 [IU]/h | INTRAVENOUS | Status: DC
  Administered 2019-12-11: 04:00:00 2.5 [IU]/h via INTRAVENOUS

## 2019-12-11 MED ORDER — OXYTOCIN 40 UNITS IN NORMAL SALINE INFUSION - SIMPLE MED
INTRAVENOUS | Status: AC
  Filled 2019-12-11: qty 1000

## 2019-12-11 MED ORDER — PRENATAL MULTIVITAMIN CH
1.0000 | ORAL_TABLET | Freq: Every day | ORAL | Status: DC
  Administered 2019-12-12 – 2019-12-13 (×2): 1 via ORAL
  Filled 2019-12-11 (×3): qty 1

## 2019-12-11 MED ORDER — ONDANSETRON HCL 4 MG/2ML IJ SOLN: 4.0000 mg | INTRAMUSCULAR | Status: DC | PRN

## 2019-12-11 MED ORDER — LACTATED RINGERS IV SOLN: 500.0000 mL | Freq: Once | INTRAVENOUS | Status: DC

## 2019-12-11 MED ORDER — WITCH HAZEL-GLYCERIN EX PADS
1.0000 "application " | MEDICATED_PAD | CUTANEOUS | Status: DC | PRN
  Administered 2019-12-11: 1 via TOPICAL

## 2019-12-11 NOTE — Anesthesia Postprocedure Evaluation (Signed)
Anesthesia Post Note  Patient: Sinead Hockman  Procedure(s) Performed: AN AD HOC LABOR EPIDURAL     Patient location during evaluation: Mother Baby Anesthesia Type: Epidural Level of consciousness: awake and alert and oriented Pain management: satisfactory to patient Vital Signs Assessment: post-procedure vital signs reviewed and stable Respiratory status: respiratory function stable Cardiovascular status: stable Postop Assessment: no headache, no backache, epidural receding, patient able to bend at knees, no signs of nausea or vomiting and adequate PO intake Anesthetic complications: no    Last Vitals:  Vitals:   12/11/19 0750 12/11/19 1113  BP: (!) 94/53 103/62  Pulse: 88 (!) 102  Resp: 20 18  Temp: 36.6 C 37 C  SpO2: 98% 99%    Last Pain:  Vitals:   12/11/19 1200  TempSrc:   PainSc: 0-No pain   Pain Goal: Patients Stated Pain Goal: 2 (12/11/19 1039)                 Roshawn Lacina

## 2019-12-11 NOTE — Progress Notes (Signed)
Pt and husband called out that pt went to bathroom and is in so much pain and need help back in bed. Rn went to pt room and pt was kneeling infront of her bed in labor pain, said pain was on and off  And started about an hour ago.Jeanene Erb another RN for help, she checked pt and called LXD charge and called the Nurse Midwive. Pt was transferred to the labor and delivery floor room 216.

## 2019-12-11 NOTE — Lactation Note (Signed)
This note was copied from a baby's chart. Lactation Consultation Note  Patient Name: Andrea Escobar [id number] Date: 12/11/2019   P1, Baby 10 hours old in NICU.  CGA 32 weeks. Mother and father declined french interpretation.  Reviewed hand expression.  Observed mother pumping with 24 flanges which are the appropriate size. Nipples evert and compressible.  Mother has personal DEBP at home. Recommend pumping q 2- 2.5 hours during the day and q 3-4 hours at night for a minimum of 8 times per day. Encouraged hand expression before and after pumping.    Discussed cleaning and milk storage.  Labels have been ordered and mother provided with NICU booklet. Mom made aware of O/P services, breastfeeding support groups, community resources, and our phone # for post-discharge questions.     Maternal Data    Feeding Feeding Type: Donor Breast Milk  LATCH Score                   Interventions    Lactation Tools Discussed/Used     Consult Status      [name] 12/11/2019, 2:24 PM

## 2019-12-11 NOTE — Anesthesia Procedure Notes (Signed)
Epidural Patient location during procedure: OB Start time: 12/11/2019 2:07 AM End time: 12/11/2019 2:10 AM  Staffing Anesthesiologist: Leilani Able, MD Performed: anesthesiologist   Preanesthetic Checklist Completed: patient identified, IV checked, site marked, risks and benefits discussed, surgical consent, monitors and equipment checked, pre-op evaluation and timeout performed  Epidural Patient position: sitting Prep: DuraPrep and site prepped and draped Patient monitoring: continuous pulse ox and blood pressure Approach: midline Location: L3-L4 Injection technique: LOR air  Needle:  Needle type: Tuohy  Needle gauge: 17 G Needle length: 9 cm and 9 Needle insertion depth: 6 cm Catheter type: closed end flexible Catheter size: 19 Gauge Catheter at skin depth: 11 cm Test dose: negative and Other  Assessment Events: blood not aspirated, injection not painful, no injection resistance, no paresthesia and negative IV test  Additional Notes Reason for block:procedure for pain

## 2019-12-11 NOTE — Progress Notes (Signed)
Labor Progress Note  Subjective: Andrea Escobar, 29 y.o., G1P0, with an IUP @ [redacted]w[redacted]d, came to labor and delivery from the antepartum unit in labor. Pt started having regular contractions a few hours ago and was found to be complete. Pt resting in bed with partner at the bedside. Instructions given on how to effectively push. Pt attempts pushing for 30 minutes with no progress. Pt desires epidural.   Patient Active Problem List   Diagnosis Date Noted  . Preterm premature rupture of membranes in third trimester 11/27/2019  . Preterm premature rupture of membranes 11/27/2019   Objective: BP (!) 130/97   Pulse (!) 129   Temp 98.6 F (37 C) (Oral)   Resp 18   Ht 5\' 6"  (1.676 m)   Wt 90.4 kg   SpO2 99%   BMI 32.17 kg/m  I/O last 3 completed shifts: In: 7659.6 [P.O.:4200; I.V.:3459.6] Out: 6650 [Urine:6650] No intake/output data recorded. NST: FHR baseline 135 bpm, Variability: moderate, Accelerations:present, Decelerations:  Absent= Cat 1/Reactive CTX:  regular, every 2-3 minutes Uterus gravid, soft non tender, moderate to palpate with contractions.  SVE:  Dilation: 10 Effacement (%): 100 Station: Plus 1, Plus 2 Exam by:: [email or web address] RN   Assessment:  Andrea Escobar, 29 y.o., G1P0, with an IUP @ [redacted]w[redacted]d, presented to labor and delivery complete/+2.  PPROM on 11/27/19 x 14 days, no s/sx of infection NICHD: Category 1 FHT SVE 10/+2 Non-effective pushing effort for 30 minutes, pt desires epidural.  GBS negative A1GDM: stable sugar last night - 108 post pranidal BMZ 3/24 and 3/25 and 4/5 and 4/6  Plan: Will get epidural and resume pushing when pt is comfortable Anticipate labor progression and vaginal delivery.   Dr. 6/6 aware of plan and verbalized agreement.   Normand Sloop, CNM, MSN 12/11/2019. 1:49 AM

## 2019-12-11 NOTE — Anesthesia Preprocedure Evaluation (Signed)
Anesthesia Evaluation  Patient identified by MRN, date of birth, ID band Patient awake    Reviewed: Allergy & Precautions, H&P , NPO status , Patient's Chart, lab work & pertinent test results  Airway Mallampati: I  TM Distance: >3 FB Neck ROM: full    Dental no notable dental hx. (+) Teeth Intact   Pulmonary neg pulmonary ROS,    Pulmonary exam normal breath sounds clear to auscultation       Cardiovascular negative cardio ROS Normal cardiovascular exam Rhythm:regular Rate:Normal     Neuro/Psych negative neurological ROS  negative psych ROS   GI/Hepatic negative GI ROS, Neg liver ROS,   Endo/Other  negative endocrine ROS  Renal/GU negative Renal ROS  negative genitourinary   Musculoskeletal negative musculoskeletal ROS (+)   Abdominal   Peds  Hematology  (+) Blood dyscrasia, anemia ,   Anesthesia Other Findings   Reproductive/Obstetrics (+) Pregnancy                             Anesthesia Physical Anesthesia Plan  ASA: II  Anesthesia Plan: Epidural   Post-op Pain Management:    Induction:   PONV Risk Score and Plan:   Airway Management Planned:   Additional Equipment:   Intra-op Plan:   Post-operative Plan:   Informed Consent: I have reviewed the patients History and Physical, chart, labs and discussed the procedure including the risks, benefits and alternatives for the proposed anesthesia with the patient or authorized representative who has indicated his/her understanding and acceptance.       Plan Discussed with:   Anesthesia Plan Comments:         Anesthesia Quick Evaluation

## 2019-12-11 NOTE — Progress Notes (Signed)
Patient screened out for psychosocial assessment since none of the following apply: °Psychosocial stressors documented in mother or baby's chart °Gestation less than 32 weeks °Code at delivery  °Infant with anomalies °Please contact the Clinical Social Worker if specific needs arise, by MOB's request, or if MOB scores greater than 9/yes to question 10 on Edinburgh Postpartum Depression Screen. ° °Andrea Escobar, MSW, LCSW °Clinical Social Work °[phone] °  °

## 2019-12-11 NOTE — Procedures (Signed)
Called to patient's room by patient's midwife. Cord avulsed during placental delivery without delivery of placenta. After patient consented, manual extraction of placenta done. Exploration of uterus done with confirmation of no retained products or palpable defects of uterus. No hemorrhage after delivery. Good uterine tone noted.  Repair of laceration done by midwife.  Levie Heritage, DO 12/11/2019

## 2019-12-12 LAB — CBC
HCT: 29.4 % — ABNORMAL LOW (ref 36.0–46.0)
Hemoglobin: 9 g/dL — ABNORMAL LOW (ref 12.0–15.0)
MCH: 23.6 pg — ABNORMAL LOW (ref 26.0–34.0)
MCHC: 30.6 g/dL (ref 30.0–36.0)
MCV: 77 fL — ABNORMAL LOW (ref 80.0–100.0)
Platelets: 185 10*3/uL (ref 150–400)
RBC: 3.82 MIL/uL — ABNORMAL LOW (ref 3.87–5.11)
RDW: 17.2 % — ABNORMAL HIGH (ref 11.5–15.5)
WBC: 14.4 10*3/uL — ABNORMAL HIGH (ref 4.0–10.5)
nRBC: 0.2 % (ref 0.0–0.2)

## 2019-12-12 LAB — SURGICAL PATHOLOGY

## 2019-12-12 MED ORDER — FERROUS GLUCONATE 324 (38 FE) MG PO TABS
324.0000 mg | ORAL_TABLET | Freq: Every day | ORAL | Status: DC
  Administered 2019-12-12 – 2019-12-13 (×2): 324 mg via ORAL
  Filled 2019-12-12 (×2): qty 1

## 2019-12-12 NOTE — Progress Notes (Signed)
Postpartum Note Day # 1  S:  Patient resting comfortable in bed.  Pain controlled.  Tolerating carb modified diet. + flatus, no BM.  Lochia moderate.  Ambulating without difficulty.  She denies n/v/f/c, SOB, or CP.  Pt plans on breastfeeding.  O: Temp:  [97.8 F (36.6 C)-98.8 F (37.1 C)] 98.2 F (36.8 C) (04/08 0443) Pulse Rate:  [87-102] 87 (04/08 0443) Resp:  [18-20] 18 (04/08 0443) BP: (89-112)/(52-73) 89/52 (04/08 0443) SpO2:  [98 %-100 %] 100 % (04/08 0443)   Gen: A&Ox3, NAD CV: RRR, no MRG Resp: CTAB Abdomen: soft, NT, ND Uterus: firm, non-tender, below umbilicus Ext: No edema, no calf tenderness bilaterally, SCDs in place  Labs:  Recent Labs    12/11/19 0134 12/12/19 0438  HGB 9.8* 9.0*    A/P: Pt is a 29 y.o. G1P0101 s/p NSVD, PPD#1  - Pain well controlled -GU: UOP is adequate, voiding freely -GI: Tolerating general diet -Activity: encouraged sitting up to chair and ambulation as tolerated -Prophylaxis: early ambulation -Labs: stable as above, continue with iron daily  Myna Hidalgo, DO [phone] (cell) [phone] (office)

## 2019-12-12 NOTE — Lactation Note (Signed)
This note was copied from a baby's chart. Lactation Consultation Note  Patient Name: Andrea Escobar [id number] Date: 12/12/2019   [facility] attempted twice to visit with Mom today (1st time she wasn't in room, and second time Mom sound asleep).  P1 Mom of 32 wk baby in the NICU.  Baby is 38 hrs old.    Pump set up at bedside.  LC washed pump parts that were in bag on pump, and set in separate drying bin counter.    LC wrote on dry erase board for Mom to pump both breasts every 2-3 hrs.  Asked RN to ask Mom if she needed assistance with pumping.     [name] 12/12/2019, 1:56 PM

## 2019-12-12 NOTE — Progress Notes (Signed)
  CSW attempted to meet with MOB in room 110 to complete an assessment for Edinburgh score of 16.  MOB as sleeping. CSW will attempt to meet with MOB again tomorrow (4/9).  CSW will continue to offer resources and supports to family while infant remains in NICU.    Blaine Hamper, MSW, LCSW Clinical Social Work [phone]

## 2019-12-13 MED ORDER — IBUPROFEN 600 MG PO TABS: 600.0000 mg | ORAL_TABLET | Freq: Four times a day (QID) | ORAL | 0 refills | Status: DC

## 2019-12-13 MED ORDER — FERROUS GLUCONATE 324 (38 FE) MG PO TABS: 324.0000 mg | ORAL_TABLET | Freq: Every day | ORAL | 1 refills | Status: DC

## 2019-12-13 MED ORDER — DOCUSATE SODIUM 100 MG PO CAPS: 100.0000 mg | ORAL_CAPSULE | Freq: Two times a day (BID) | ORAL | 0 refills | Status: DC

## 2019-12-13 NOTE — Progress Notes (Signed)
Discharge instructions and prescriptions given to pt. Discussed post vaginal delivery care, signs and symptoms to report to the MD, upcoming appointments, and meds. Pt verbalizes understanding and has no questions or concerns at this time. Pt discharged from hospital in stable condition. 

## 2019-12-13 NOTE — Clinical Social Work Maternal (Signed)
  CLINICAL SOCIAL WORK MATERNAL/CHILD NOTE  Patient Details  Name: Andrea Escobar MRN: [id number] Date of Birth: June 06, 1991  Date:  12/13/2019  Clinical Social Worker Initiating Note:  Blaine Hamper Date/Time: Initiated:  12/13/19/1128     Child's Name:  Andrea Escobar   Biological Parents:  Mother, Father   Need for Interpreter:  None   Reason for Referral:  Parental Support of Premature Babies < 32 weeks/or Critically Ill babies   Address:  [address] [place] [address]    Phone number:  [phone] (home)     Additional phone number: FOB's number is [phone]  Household Members/Support Persons (HM/SP):   Household Member/Support Person 1, Household Member/Support Person 2   HM/SP Name Relationship DOB or Age  HM/SP -1 Christielle Mopede daughter 01/16/2010  HM/SP -2 Pathy Dgema FOb/Husband 11/29/1987  HM/SP -3        HM/SP -4        HM/SP -5        HM/SP -6        HM/SP -7        HM/SP -8          Natural Supports (not living in the home):  Extended Family(Per MOB, FOB's family will be the primary source of support.)   Professional Supports: None   Employment: Unemployed   Type of Work:     Education:  Counsellor arranged:    Surveyor, quantity Resources:  [employer]   Other Resources:  [employer], Sales executive    Cultural/Religious Considerations Which May Impact Care:  None reported  Strengths:  Ability to meet basic needs , Home prepared for child (a list of peds providers was given to [employer].)   Psychotropic Medications:         Pediatrician:       Pediatrician List:   [facility] [place]    [place]      Pediatrician Fax Number:    Risk Factors/Current Problems:  None   Cognitive State:  Alert , Able to Concentrate , Goal Oriented , Insightful , Linear Thinking    Mood/Affect:  Comfortable , Interested , Calm , Bright , Happy    CSW  Assessment: See progress noted dated for 12/13/2018.  CSW Plan/Description:  Psychosocial Support and Ongoing Assessment of Needs, Sudden Infant Death Syndrome (SIDS) Education, Perinatal Mood and Anxiety Disorder (PMADs) Education, Other Information/Referral to [employer], MSW, [employer] Clinical Social Work [phone]    Barbara Cower, LCSW 12/13/2019, 11:30 AM

## 2019-12-13 NOTE — Discharge Instructions (Signed)

## 2019-12-13 NOTE — Progress Notes (Signed)
CSW received consult due to score 16 on Edinburgh Depression Screen.    When CSW arrived, MOB was in bed pumping and watching TV. CSW explained CSW's role and MOB was receptive to meeting with CSW. With the use of video interpreter (Madeline #[id number])CSW reviewed MOB's Edinburgh results and MOB acknowledged that she did not read English well (French is MOB's primary language) and may have answered some questions incorrectly. MOB agreed to be re assessed utilizing the French version. MOB was adamant that she has not ever experienced any HI or SI. When CSW asked about MOB's thoughts and feeling MOB reported, "I'm a litter sad that I'm discharging today and will have to leave my baby." CSW normalized and validated MOB's thoughts and feeling and reviewed NICU visitation policy. MOB denied having any barriers to visiting and report having a good support team the primarily consisted of FOB and FOB's family.   CSW provided education regarding Baby Blues vs PMADs and provided MOB with resources for mental health follow up.  CSW encouraged MOB to evaluate her mental health throughout the postpartum period with the use of the New Mom Checklist developed by Postpartum Progress as well as the Edinburgh Postnatal Depression Scale and notify a medical professional if symptoms arise.    CSW will continue to offer resources and supports to family while infant remains in NICU.    Takima Encina Boyd-Gilyard, MSW, LCSW Clinical Social Work [phone] 

## 2019-12-13 NOTE — Lactation Note (Signed)
This note was copied from a baby's chart. Lactation Consultation Note  Patient Name: Andrea Escobar [id number] Date: 12/13/2019 Reason for consult: Follow-up assessment;Primapara;1st time breastfeeding;NICU baby;Preterm <34wks;Engorgement;Infant < 6lbs  LC in to visit with P1 Mom of 32 wk baby in the NICU.  Baby is 9 hrs old and stable feeding on donor breast milk by gavage.  Mom to be discharged today.  Mom states she has a DEBP at home.  When asked what type, she showed me the picture from the [facility] site.  Pump has no brand name other than DEBP hospital grade and price of $59.  Talked to parents about the importance of a strong hospital grade pump like the one here in her room and in baby's room in the NICU.  Mom states she has WIC.    [facility] referral faxed and Mom given phone number to call inquiring about obtaining a [employer] DEBP from them.  Mom plans to stay until late this evening.  Parents aware that [facility] office isn't open on weekends, and can obtain a [facility] loaner from [facility] for $30 cash exact change on weekend.  Parents know where the [facility] office is.  Asked how pumping is going, Mom states her breasts are painful.  Both breasts slightly engorged.  Heat packs applied and Mom to massage breasts and then double pump.  It had been 5 hrs since she pumped.  Talked about importance of pumping every 2-3 hrs for 15-30 mins.  If breasts do not soften, Mom instructed to place ice packs on breasts while lying flat in bed, for 20 mins.  Heat and massage right before or during pumping.   Mom is very motivated to provide breastmilk to baby.    Mom will let her nurse know if she has further concerns and needs to speak to [facility].   Interventions Interventions: Breast feeding basics reviewed;Skin to skin;Breast massage;Hand express;DEBP  Lactation Tools Discussed/Used Tools: Pump Breast pump type: Double-Electric Breast Pump WIC Program: Yes Pump Review: Setup, frequency, and cleaning;Milk  Storage   Consult Status Consult Status: Follow-up Date: 12/20/19 Follow-up type: In-patient    Andrea Escobar 12/13/2019, 1:18 PM

## 2019-12-13 NOTE — Discharge Summary (Signed)
  Postpartum Discharge Summary     Patient Name: Andrea Escobar DOB: 12/04/1990 MRN: [id number]  Date of admission: 11/27/2019 Delivering Provider: JONES, AMANDA K   Date of discharge: 12/13/2019  Admitting diagnosis: Preterm premature rupture of membranes [O42.919] Preterm labor [O60.00] Vaginal delivery [O80] Intrauterine pregnancy: [redacted]w[redacted]d     Secondary diagnosis:  Active Problems:   Preterm premature rupture of membranes in third trimester   Preterm premature rupture of membranes   Preterm labor   Vaginal delivery  Additional problems: Gestational Diabetes     Discharge diagnosis: Preterm Pregnancy Delivered and GDM A1                                                                                                Post partum procedures:none  Augmentation: none  Complications: None  Hospital Course:  [name] was admitted on 3/24 due to PPROM.  She was treated with latency antibiotics, tocolysis and betamethasone.  She was monitored in hospital during that time.  On 12/11/19, she had reported worsening pain and was noted to be complete dilation.  She was moved from antepartum to L&D where she then had a vaginal delivery.  Patient had an uncomplicated labor course as follows:  Membrane Rupture Time/Date: 3:00 AM ,11/27/2019   Intrapartum Procedures: Episiotomy: None [1]                                         Lacerations:  1st degree [2]  Patient had a delivery of a Viable infant. 12/11/2019  Information for the patient's newborn:  Andrea Escobar [[id number]]       Pateint had an uncomplicated postpartum course.  She is ambulating, tolerating a regular diet, passing flatus, and urinating well. Patient is discharged home in stable condition on 12/13/19.  Delivery time: 3:45 AM    Magnesium Sulfate received: Yes BMZ received: Yes Rhophylac:N/A MMR:N/A Transfusion:No  Physical exam  Vitals:   12/12/19 1529 12/12/19 1530 12/12/19 2328 12/13/19 0622  BP: 106/60   98/76 102/60  Pulse: 95  79 90  Resp: 18  18 18  Temp: 98.2 F (36.8 C)  98.4 F (36.9 C) 98.4 F (36.9 C)  TempSrc: Oral  Oral Oral  SpO2:  95% 98% 99%  Weight:      Height:       General: alert, cooperative and no distress Lochia: appropriate Uterine Fundus: firm Incision: N/A DVT Evaluation: No evidence of DVT seen on physical exam. Labs: Lab Results  Component Value Date   WBC 14.4 (H) 12/12/2019   HGB 9.0 (L) 12/12/2019   HCT 29.4 (L) 12/12/2019   MCV 77.0 (L) 12/12/2019   PLT 185 12/12/2019   No flowsheet data found. Edinburgh Score: Edinburgh Postnatal Depression Scale Screening Tool 12/11/2019  I have been able to laugh and see the funny side of things. 2  I have looked forward with enjoyment to things. 2  I have blamed myself unnecessarily when things went wrong. 2  I have been anxious or   worried for no good reason. 0  I have felt scared or panicky for no good reason. 1  Things have been getting on top of me. 2  I have been so unhappy that I have had difficulty sleeping. 3  I have felt sad or miserable. 1  I have been so unhappy that I have been crying. 1  The thought of harming myself has occurred to me. 2  Edinburgh Postnatal Depression Scale Total 16    Discharge instruction: per After Visit Summary and "Baby and Me Booklet".  After visit meds:  Prenatal vitamin Iron Ibuprofen Tylenol  Diet: carb modified diet  Activity: Advance as tolerated. Pelvic rest for 6 weeks.   Outpatient follow up:3 weeks Follow up Appt:No future appointments. Follow up Visit: Follow-up Information    Andrea Pupa, DO Follow up in 3 week(s).   Specialty: Obstetrics and Gynecology Contact information: [address] [id number] [phone]             Newborn Data: Live born female  Birth Weight: 4 lb 2 oz (1870 g) APGAR: 87, 10  Newborn Delivery   Birth date/time: 12/11/2019 03:45:00 Delivery type: Vaginal, Spontaneous       Baby Feeding: Breast- currently baby in NICU Disposition:NICU   12/13/2019 Andrea Genta, DO

## 2019-12-18 ENCOUNTER — Encounter: Payer: Medicaid Other | Admitting: Dietician

## 2019-12-18 ENCOUNTER — Other Ambulatory Visit: Payer: Self-pay

## 2019-12-21 ENCOUNTER — Ambulatory Visit: Payer: Self-pay

## 2019-12-21 NOTE — Lactation Note (Signed)
This note was copied from a baby's chart. Lactation Consultation Note  Patient Name: Andrea Escobar Date: 12/21/2019 Reason for consult: NICU baby;Preterm <34wks  Parents requested LC assistance with DEBP. Mother stated pumping is going well at home with hospital grade pump from [facility]. Mother reported irregular pattern of suction with pump at bedside in NICU. LC reviewed connections and pump parts to make sure everything was working properly. Mother was encouraged to show how she operated DEBP and she was using the initiation button. LC explained the difference between initiation and regular setting. Mother will use regular setting since baby is 93 days old and her volume is plentiful. FOB reports mother sometimes pump for 1 hour. LC clarified that pumping should be more than 30 minutes at time. Parents verbalize understanding. All questions were addressed at this time and encouraged parents to contact for any other questions or if continue having issues with pumping.     Johnatan Baskette A Higuera Ancidey 12/21/2019, 10:03 PM

## 2020-01-02 ENCOUNTER — Ambulatory Visit: Payer: Self-pay

## 2020-01-02 NOTE — Lactation Note (Signed)
This note was copied from a baby's chart. Lactation Consultation Note  Patient Name: Andrea Escobar [id number] Date: 01/02/2020 Reason for consult: Follow-up assessment;Preterm <34wks;Infant < 6lbs;1st time breastfeeding;NICU baby  NICU RN called and stated that Mom was complaining of nipple pain.  LC brought Mom some Comfort Gels to wear between pumping and/or feeding baby.  Instructions on care and placed one in refrigerator.    [name] 01/02/2020, 6:44 PM

## 2020-01-02 NOTE — Lactation Note (Addendum)
This note was copied from a baby's chart. Lactation Consultation Note  Patient Name: Andrea Escobar [id number] Date: 01/02/2020 Reason for consult: Follow-up assessment;Preterm <34wks;Infant < 6lbs;1st time breastfeeding;NICU baby  LC in to visit with P21 Mom of 64 week old baby AGA [redacted]w[redacted]d.  Baby is on NG feedings of EBM.  Mom has a plentiful milk supply and is pumping regularly with a DEBP from [facility].  Mom pre-pumped about 30 mins prior to arriving to NICU.  Baby swaddled in crib, and noted to be stirring awake.   Unwrapped baby and placed her STS in football hold at left breast.  Baby started showing feeding cues right away, sticking her tongue out and opening her mouth, licking milk from nipple.  Baby unable to attain a deep latch after trying a few times.  Baby showing much interest.  Initiated a 20 mm nipple shield, showing parents how to correctly turn inside out partly before apply it to nipple.  Mom's nipple pulled about half way into shield.  Baby opened and stuck her tongue out.  Showed Mom how to support baby's head well from ear to ear, tuck her little body in close to hers and wait for a wide open gape of mouth.  The moment baby latched, Mom winced and started giggling saying it was pinching.  LC adjusted baby's latch by gently tugging on chin, but Mom stated it was still pinching, but not too painful to take her off.  A few swallows with regular sucking pattern identified. Baby continued to be latched onto base of nipple, after trying repeatedly to open her mouth wider.  Reassured Mom that baby would be able to latch to her breast deeper as she grows and matures.  Baby tired after 10 mins but stayed latched and sucked on and off for 5 more.  Mom's nipple pulled well into shield, with breast milk noted in shield after baby came off.  Nipple rounded, not pinched. Assisted Mom in burping baby, and then placed baby STS on her chest as remainder of NG tube feeding was finishing.   Encouraged Mom  to continue regular double pumping.  FOB very engaged and supportive.   Feeding Feeding Type: Breast Fed  LATCH Score Latch: Repeated attempts needed to sustain latch, nipple held in mouth throughout feeding, stimulation needed to elicit sucking reflex.  Audible Swallowing: A few with stimulation  Type of Nipple: Everted at rest and after stimulation  Comfort (Breast/Nipple): Filling, red/small blisters or bruises, mild/mod discomfort  Hold (Positioning): Assistance needed to correctly position infant at breast and maintain latch.  LATCH Score: 6  Interventions Interventions: Breast feeding basics reviewed;Assisted with latch;Skin to skin;Breast massage;Hand express;Pre-pump if needed;Breast compression;Adjust position;Support pillows;Position options;DEBP  Lactation Tools Discussed/Used Tools: Nipple Dorris Carnes;Pump Nipple shield size: 20 Breast pump type: Double-Electric Breast Pump   Consult Status Consult Status: Follow-up Date: 01/08/20 Follow-up type: In-patient    [name] 01/02/2020, 6:01 PM

## 2020-01-03 ENCOUNTER — Ambulatory Visit: Payer: Self-pay

## 2020-01-03 NOTE — Lactation Note (Signed)
This note was copied from a baby's chart. Lactation Consultation Note  Patient Name: Andrea Escobar [id number] Date: 01/03/2020     RN called.  Mom requested LC assistance with BF at 5 pm.   LC arrived and waiting in NICU for mom but she did not arrive.  RN will let lactation know if mom desires another visit with the 8 pm feeding or another time.     Maternal Data    Feeding Feeding Type: Breast Milk  LATCH Score                   Interventions    Lactation Tools Discussed/Used     Consult Status      Maryruth Hancock [facility] 01/03/2020, 6:01 PM

## 2020-01-03 NOTE — Lactation Note (Signed)
This note was copied from a baby's chart. Lactation Consultation Note  Patient Name: Andrea Escobar [id number] Date: 01/03/2020  [facility] arriived and mom had just begun breastfeeding.  Fed approximately 8 minutes on right in football hold with 20 mm nipple shiield. Moms breast were pretty full.  Infant still cuing some.  However would not relatch. Hand expressed a few drops in infants mouth.  Mom had not prepumped .  Urged right now to pump prior to breastfeeding when mom plans to attempt to feed her at the breast.  Urged to call lactation at needed   Maternal Data    Feeding Feeding Type: Breast Milk  LATCH Score Latch: Grasps breast easily, tongue down, lips flanged, rhythmical sucking.  Audible Swallowing: Spontaneous and intermittent  Type of Nipple: Everted at rest and after stimulation  Comfort (Breast/Nipple): Filling, red/small blisters or bruises, mild/mod discomfort  Hold (Positioning): Assistance needed to correctly position infant at breast and maintain latch.  LATCH Score: 8  Interventions Interventions: Breast feeding basics reviewed;Assisted with latch;Support pillows  Lactation Tools Discussed/Used Tools: Nipple Shields Nipple shield size: 20   Consult Status      Andrea Escobar Andrea Escobar 01/03/2020, 11:34 PM

## 2020-01-09 ENCOUNTER — Ambulatory Visit: Payer: Self-pay

## 2020-01-09 NOTE — Lactation Note (Signed)
This note was copied from a baby's chart. Lactation Consultation Note  Patient Name: Andrea Escobar [id number] Date: 01/09/2020   Mother of 63 week old infant in NICU.  CGA [redacted]w[redacted]d.   Baby started bottle feeding yesterday and the last feeding mother chose to bottle feed instead of breastfeed. Mother states she is pumping between 90-150 ml +.  She is pumping q 3 hours.  Offered breastfeeding assistance for tomorrow and mother declined.  Mother has DEBP at home and denies questions or concerns.       Maternal Data    Feeding Feeding Type: Breast Milk  LATCH Score                   Interventions    Lactation Tools Discussed/Used     Consult Status      [name] 01/09/2020, 2:48 PM

## 2021-02-07 IMAGING — US US MFM OB DETAIL+14 WK
1 series · 12 of 28 positions shown · non-contrast
Comparison: none

[Series 1: us mfm ob detail+14 wk · 99 acquisitions, 12 frames shown]
[im 4/99]
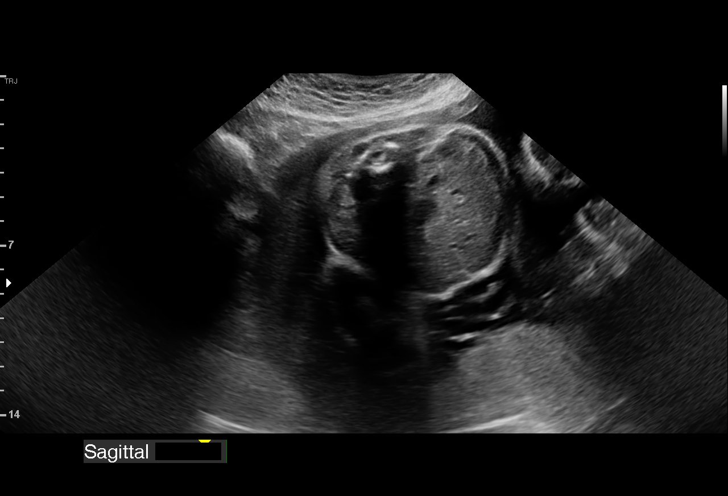
[im 11/99]
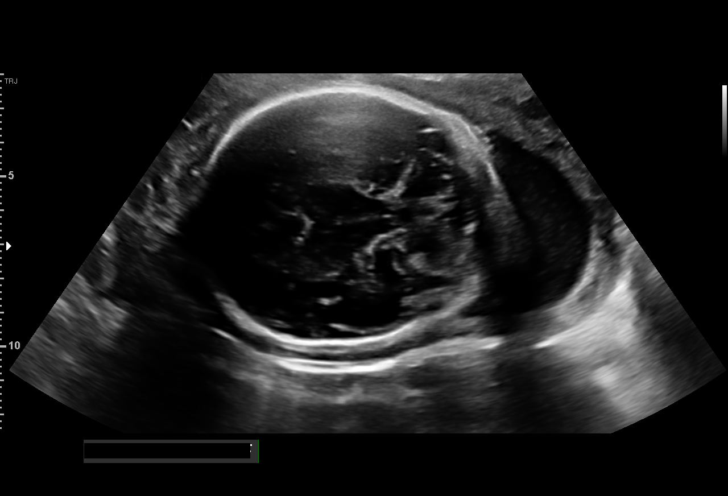
[im 19/99]
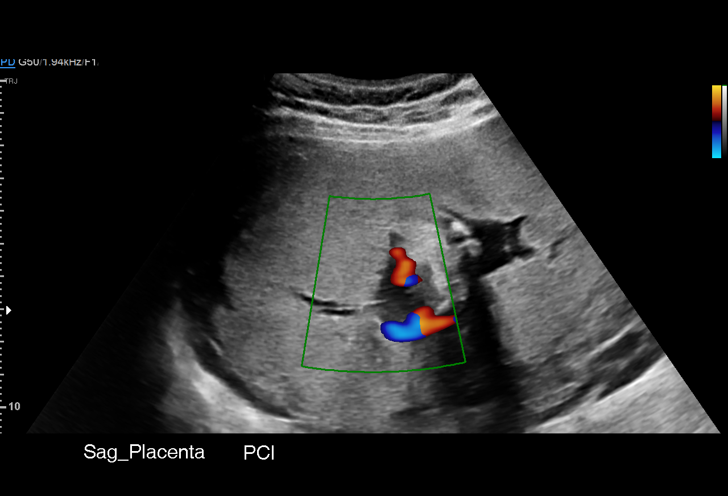
[im 30/99]
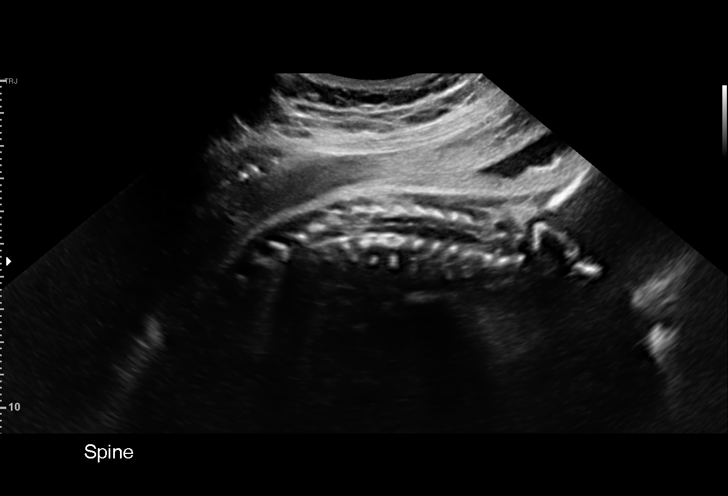
[im 37/99]
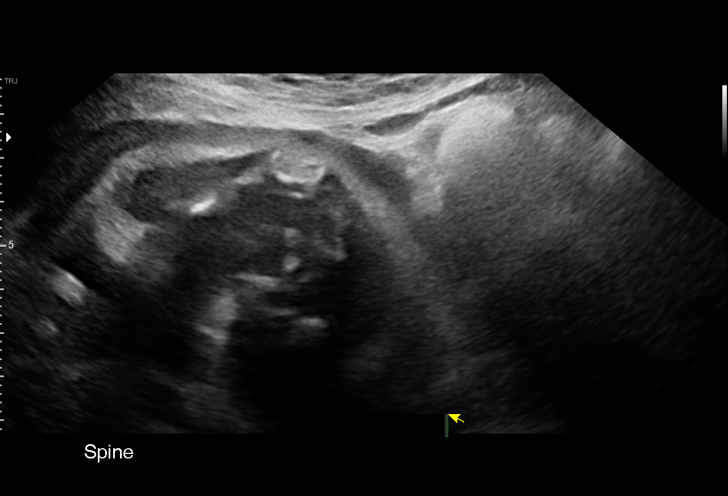
[im 44/99]
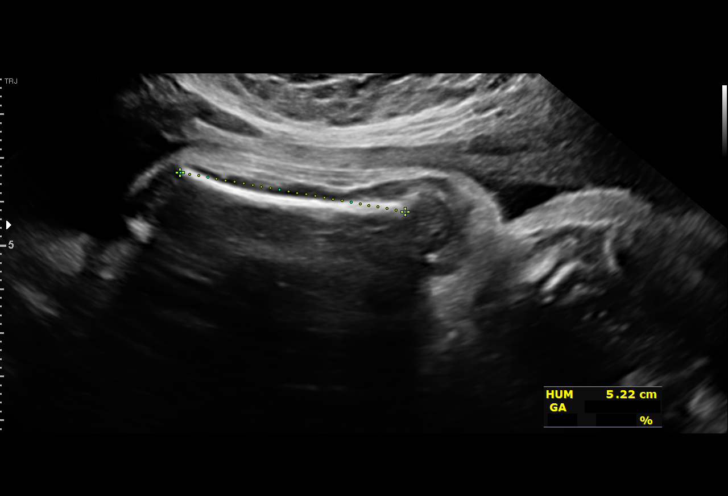
[im 55/99]
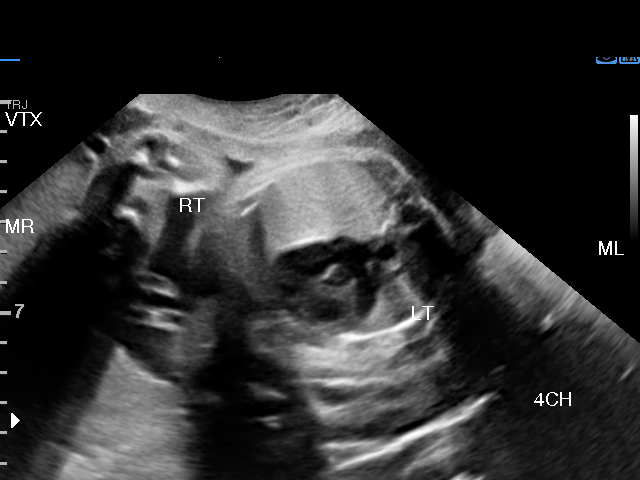
[im 62/99]
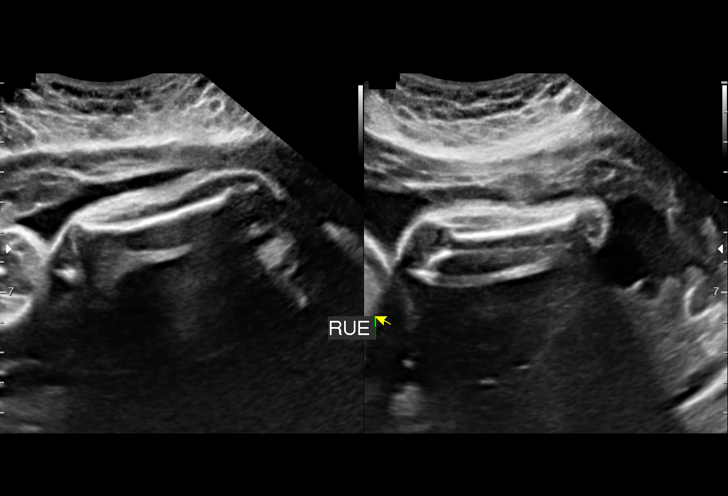
[im 69/99]
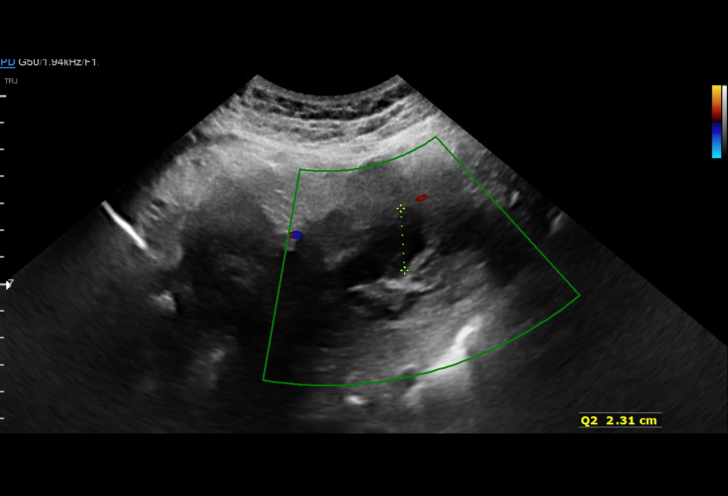
[im 80/99]
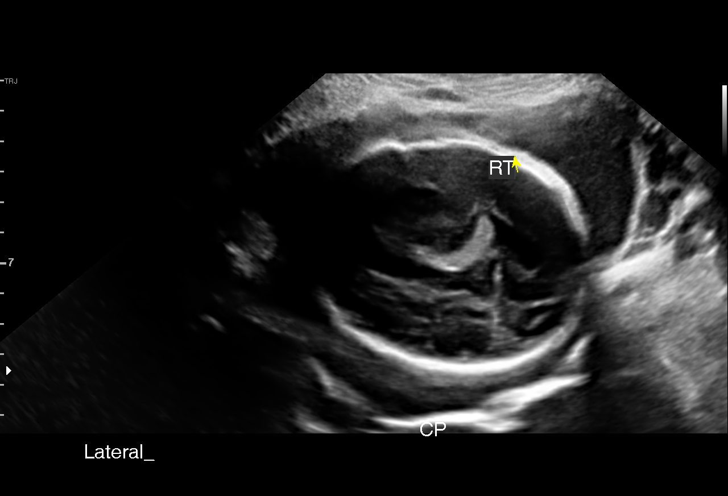
[im 88/99]
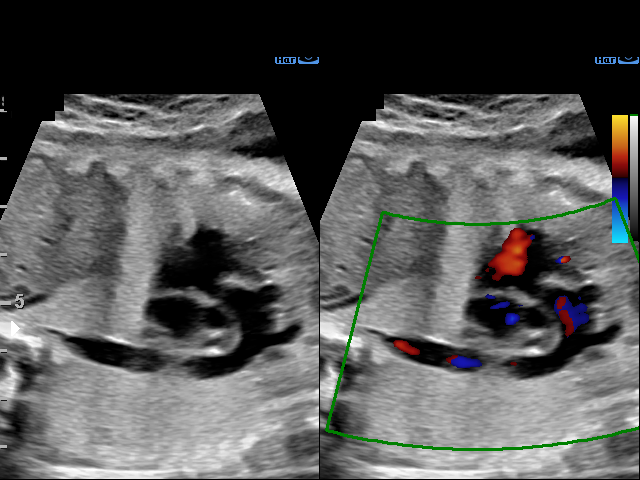
[im 95/99]
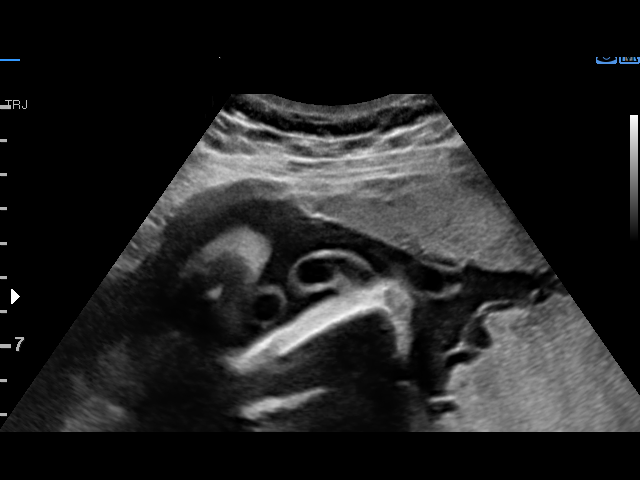

[12 of 28 positions shown; findings below may reference images not displayed]

Care

 ----------------------------------------------------------------------

 ----------------------------------------------------------------------
Indications

  Premature rupture of membranes - leaking
  fluid
  Encounter for antenatal screening for
  malformations
  Unspecified diabetes in pregnancy, third
  trimester
  29 weeks gestation of pregnancy
 ----------------------------------------------------------------------
Fetal Evaluation

 Num Of Fetuses:          1
 Fetal Heart Rate(bpm):   144
 Cardiac Activity:        Observed
 Presentation:            Cephalic
 Placenta:                Right lateral
 P. Cord Insertion:       Visualized, central

 Amniotic Fluid
 AFI FV:      Within normal limits

 AFI Sum(cm)     %Tile       Largest Pocket(cm)
 12.78           36

 RUQ(cm)       RLQ(cm)       LUQ(cm)        LLQ(cm)

Biometry

 BPD:      74.2  mm     G. Age:  29w 5d         39  %    CI:        73.17   %    70 - 86
                                                         FL/HC:       20.8  %    19.2 -
 HC:      275.7  mm     G. Age:  30w 1d         27  %    HC/AC:       1.02       0.99 -
 AC:      269.6  mm     G. Age:  31w 0d         82  %    FL/BPD:      77.4  %    71 - 87
 FL:       57.4  mm     G. Age:  30w 1d         45  %    FL/AC:       21.3  %    20 - 24
 HUM:        52  mm     G. Age:  30w 2d         62  %
 CER:      37.4  mm     G. Age:  32w 1d         83  %
 CM:        4.4  mm

 Est. FW:    5841   gm     3 lb 8 oz     68  %
OB History

 Gravidity:    1
Gestational Age

 LMP:           29w 5d        Date:  05/03/19                 EDD:   02/07/20
 U/S Today:     30w 2d                                        EDD:   02/03/20
 Best:          29w 5d     Det. By:  LMP  (05/03/19)          EDD:   02/07/20
Anatomy

 Cranium:               Appears normal         Aortic Arch:            Appears normal
 Cavum:                 Appears normal         Ductal Arch:            Appears normal
 Ventricles:            Not well visualized    Diaphragm:              Appears normal
 Choroid Plexus:        Appears normal         Stomach:                Appears normal, left
                                                                       sided
 Cerebellum:            Appears normal         Abdomen:                Appears normal
 Posterior Fossa:       Appears normal         Abdominal Wall:         Not well visualized
 Nuchal Fold:           Not applicable (>20    Cord Vessels:           Appears normal (3
                        wks GA)                                        vessel cord)
 Face:                  Appears normal         Kidneys:                Appear normal
                        (orbits and profile)
 Lips:                  Appears normal         Bladder:                Appears normal
 Palate:                Appears normal         Spine:                  Appears normal
                                                                       with some ltd views
 Heart:                 Appears normal         Upper Extremities:      Appears normal
                        (4CH, axis, and
                        situs)
 RVOT:                  Appears normal         Lower Extremities:      Appears normal
 LVOT:                  Appears normal

 Other:  RT heel and open rt hand seen. Nasal bone visualized.
Cervix Uterus Adnexa

 Cervix
 Length:           0.65  cm.
 Appears shortened and funneled by TAS scam

 Left Ovary
 Not visualized. No adnexal mass visualized.

 Right Ovary
 Within normal limits.
Comments

 Lusine Jim was seen in consultation due to PPROM.
 She is currently at 29 weeks 5 days.  This is her first
 pregnancy. The patient reports that she probably ruptured
 membranes at around 3 am this morning.  She reports that
 she is still leaking a small amount of fluid and reports feeling
 fetal movements throughout the day. She also notes
 intermittent mild lower abdominal cramping. She was recently
 diagnosed with gestational diabetes. She denies any
 significant past medical history.
 The patient had an ultrasound performed this afternoon that
 showed an overall EFW of 3 pounds 8 ounces (68th
 percentile for her gestational age). Normal amniotic fluid with
 a total AFI of 12.78 cm was noted. The fetus was in the
 vertex presentation. Her cervix was noted to be funneling and
 probably fingertip to 1 cm dilated as noted on the abdominal
 ultrasound.
 Due to PPROM at her current gestational age, the patient is
 receiving latency antibiotics and a complete course of
 antenatal corticosteroids.
 The usual management and implications of PPROM were
 discussed with the patient.  She was advised that due to
 rupture of membranes, she will require inpatient management
 until delivery with daily fetal testing.  She should receive a
 complete course of antenatal corticosteroids and complete a
 course of latency antibiotics.  She was advised that due to
 PPROM, delivery will be recommended at around 34 weeks.
 Delivery prior to this time will be indicated should she go into
 spontaneous labor, should she show any signs of an
 intrauterine infection, or at any time for nonreassuring fetal
 status.
 Should she require delivery before 32 weeks, magnesium
 sulfate should be given for fetal neuro protection.  Should she
 be at risk for delivery and it has been 7 days or greater since
 she received the initial course of antenatal corticosteroids, a
 rescue course of steroids should be given.
 The patient understands that her baby will require a NICU
 admission following delivery.
 Due to gestational diabetes, the patient should continue to
 have her fingersticks monitored on a daily basis and should
 be started on either Metformin or insulin should the majority
 of her fingerstick values be elevated above the normal range
 (fasting fingerstick values of 95 mg/dL or less  and 2 hours
 postprandial values of 120 mg/dL or less).
 All conversations were held with the patient with the help of a
 French interpreter.
 At the end of the consultation, the patient stated that all her
 questions had been answered to her complete satisfaction.
 Thank you for referring this patient for Maternal-Fetal
 Medicine consultation.

 Recommendations:
 -Inpatient management until delivery
 -Daily fetal testing
 -Weekly biophysical profiles with fluid checks
 -Latency antibiotics
 -Magnesium sulfate for fetal neuro protection should she
 require delivery before 32 weeks
 -Rescue course of steroids as indicated
 -Delivery at around 34 weeks or earlier should she go into
 spontaneous labor, should she show any signs of an
 intrauterine infection, or at any time for nonreassuring fetal
 status
 -Monitor daily fingerstick values and institute appropriate
 treatment if necessary

## 2021-02-12 IMAGING — US US MFM FETAL BPP W/O NON-STRESS
1 series · 15 of 25 positions shown · non-contrast
Comparison: none

[Series 1: us mfm fetal bpp w/o non-stress · 25 acquisitions, 15 frames shown]
[im 1/25]
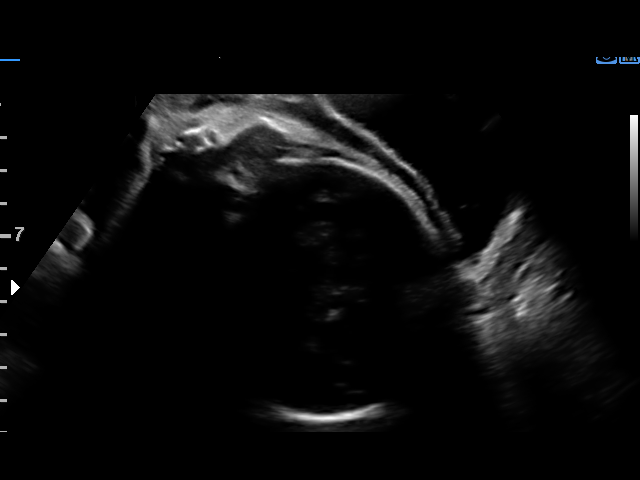
[im 3/25]
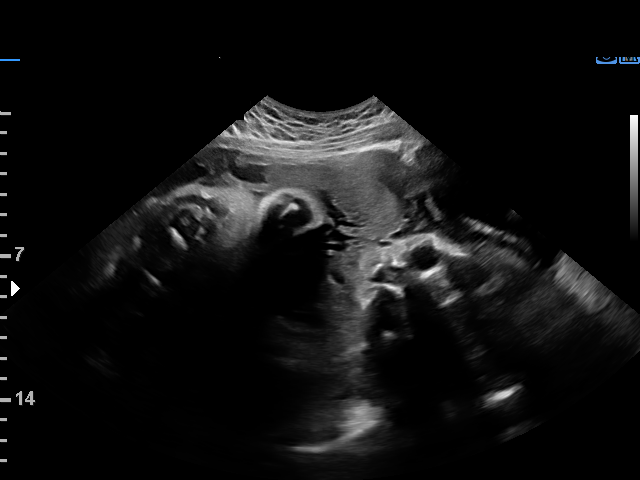
[im 5/25]
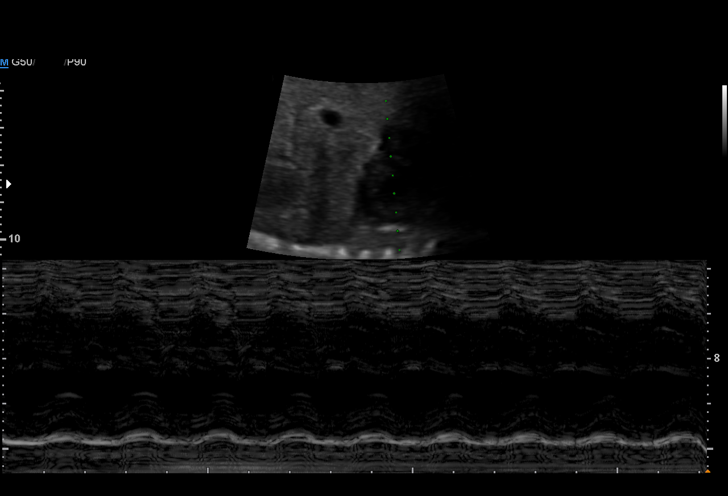
[im 6/25]
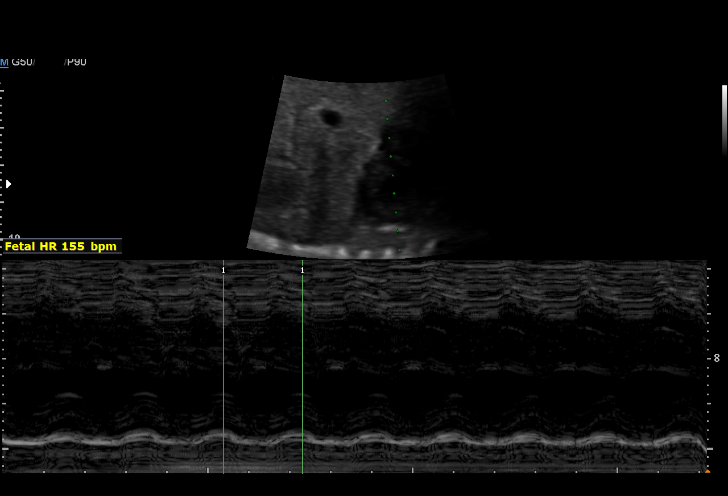
[im 8/25]
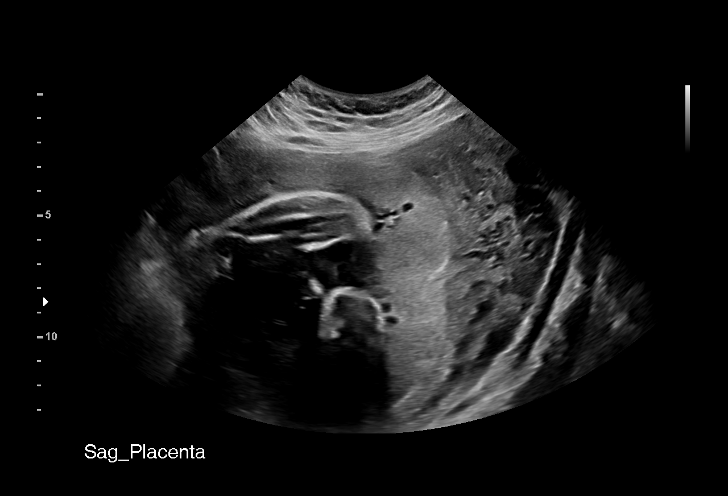
[im 10/25]
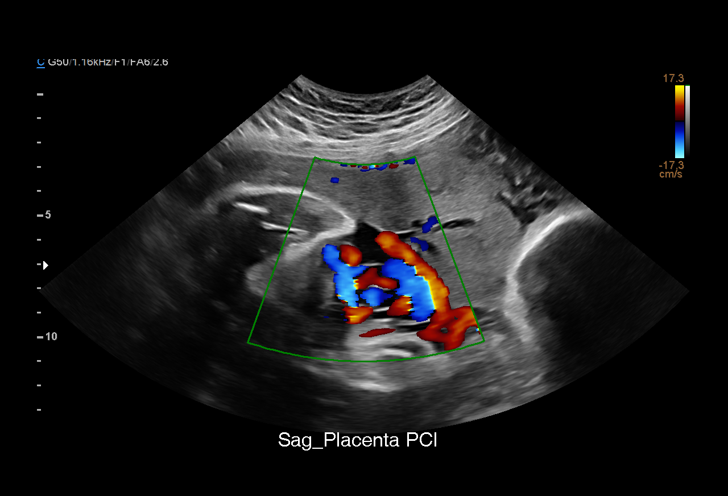
[im 11/25]
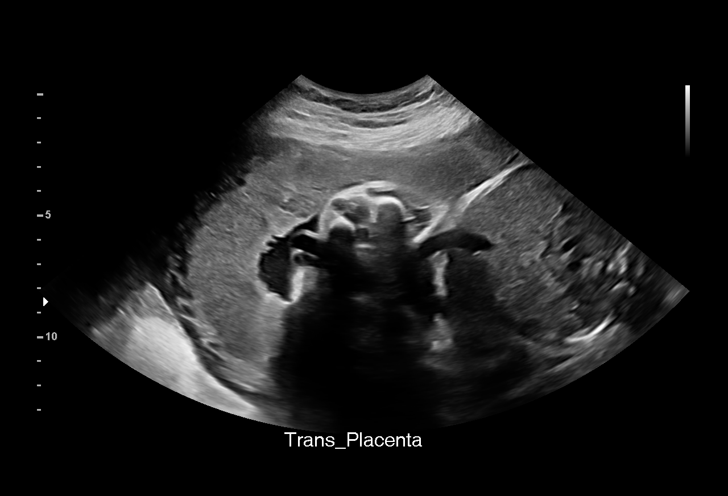
[im 13/25]
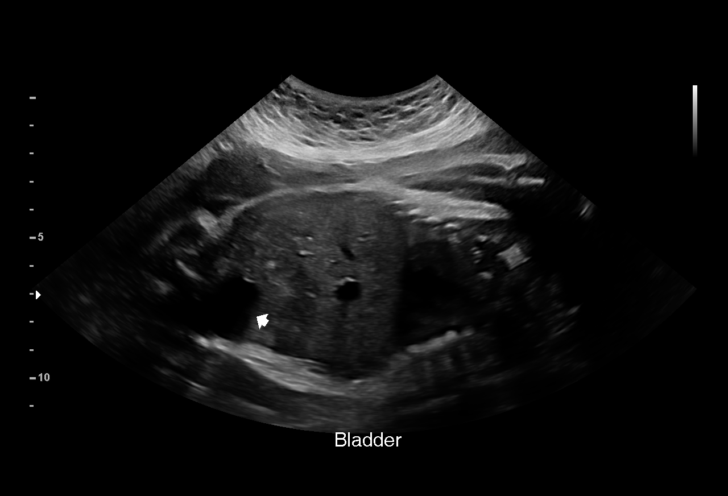
[im 15/25]
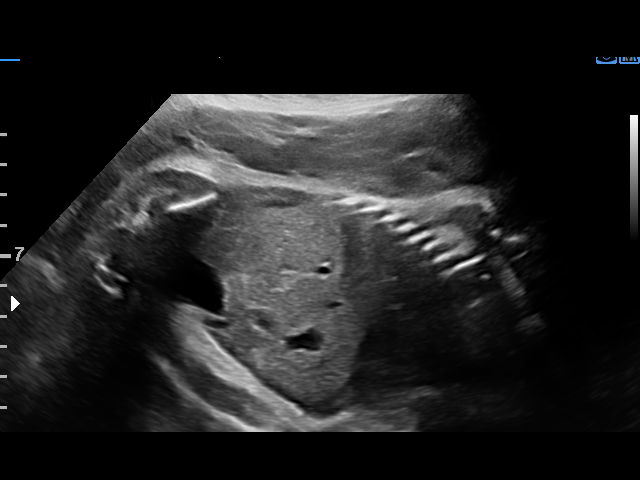
[im 16/25]
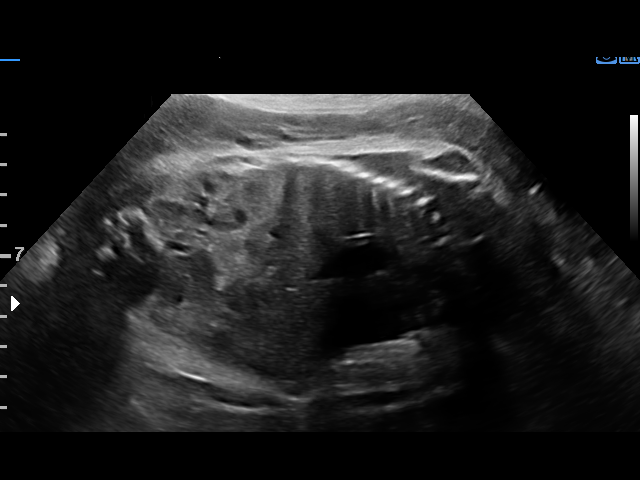
[im 18/25]
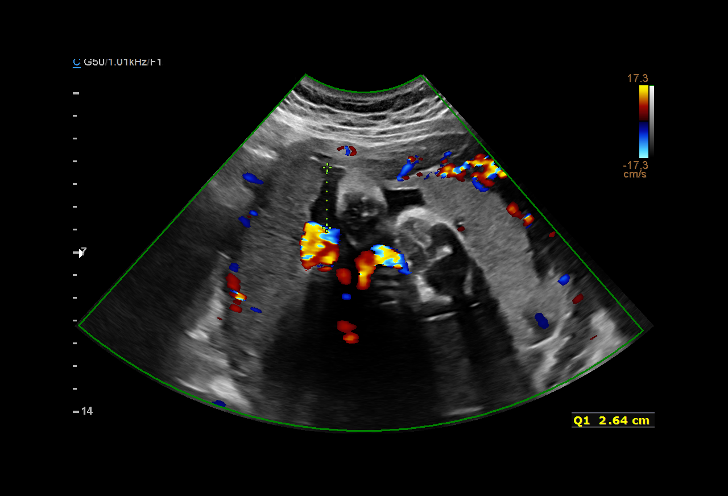
[im 20/25]
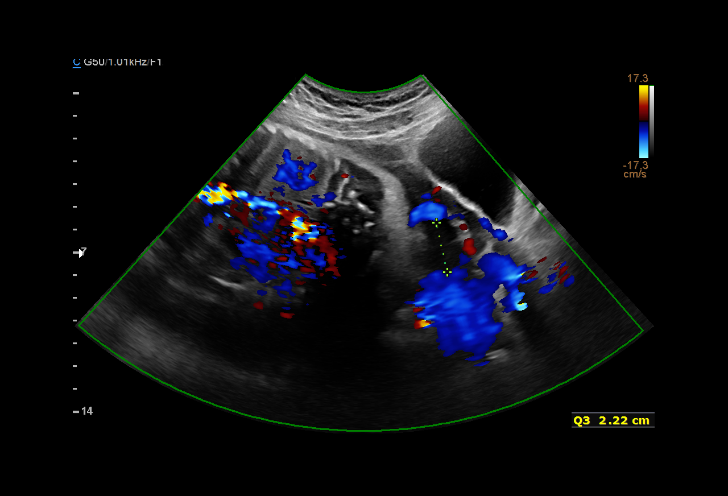
[im 21/25]
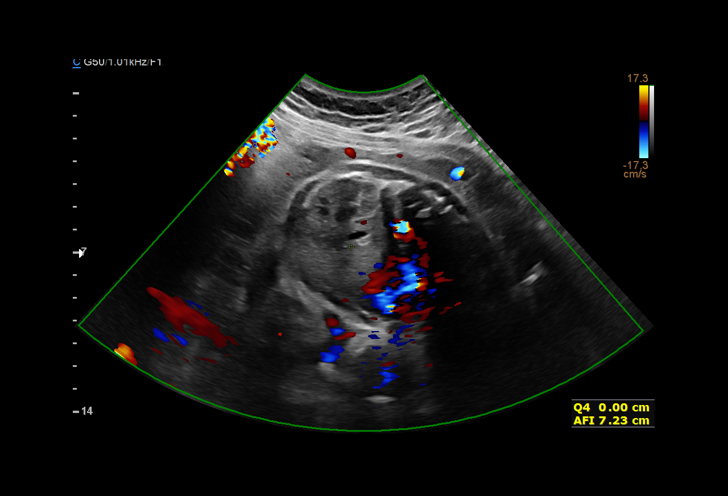
[im 23/25]
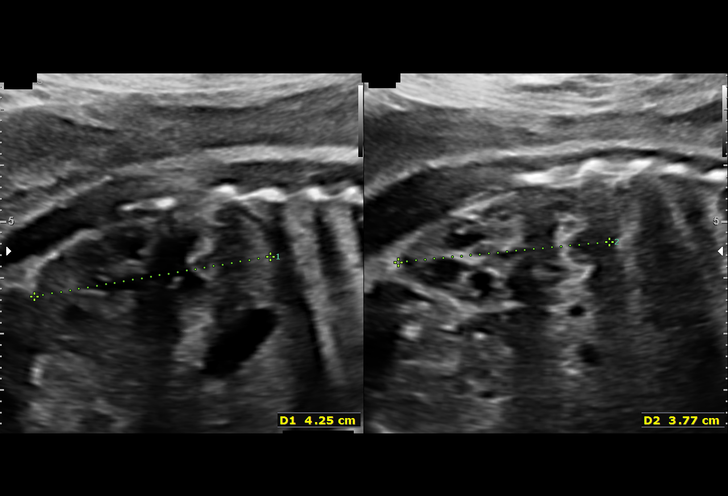
[im 25/25]
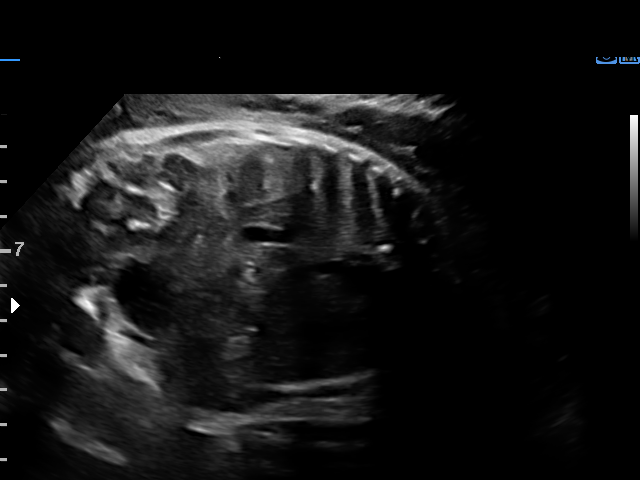

[15 of 25 positions shown; findings below may reference images not displayed]

Attending:        Edelss Valide        Secondary Phy.:   [facility]
                                                            Care

 ----------------------------------------------------------------------

 ----------------------------------------------------------------------
Indications

  Premature rupture of membranes - leaking
  fluid
  Unspecified diabetes in pregnancy, third
  trimester
  30 weeks gestation of pregnancy
 ----------------------------------------------------------------------
Fetal Evaluation

 Num Of Fetuses:         1
 Fetal Heart Rate(bpm):  155
 Cardiac Activity:       Observed
 Presentation:           Cephalic
 Placenta:               Right lateral
 P. Cord Insertion:      Visualized

 Amniotic Fluid
 AFI FV:      Within normal limits

 AFI Sum(cm)     %Tile       Largest Pocket(cm)
 10.28           16

 RUQ(cm)       RLQ(cm)       LUQ(cm)        LLQ(cm)
 5.7           0
Biophysical Evaluation

 Amniotic F.V:   Within normal limits       F. Tone:        Observed
 F. Movement:    Observed                   Score:          [DATE]
 F. Breathing:   Not Observed
OB History

 Gravidity:    1
Gestational Age

 LMP:           30w 3d        Date:  05/03/19                 EDD:   02/07/20
 Best:          30w 3d     Det. By:  LMP  (05/03/19)          EDD:   02/07/20
Anatomy

 Diaphragm:             Appears normal         Kidneys:                Appear normal
 Stomach:               Appears normal, left   Bladder:                Appears normal
                        sided
 Abdomen:               Appears normal
Impression

 Patient is admitted with diagnosis of PPROM.
 Amniotic fluid is normal and good fetal activity is seen.
 Cephalic presentation. Fetal breathing movements did not
 meet the criteria (BPP). I reviewed the NST, which is reactive.
Recommendations

 -Repeat BPP if NST is nonreactiev.
 -BPP next week.
                 Shukla, Anant

## 2021-06-18 LAB — HEPATITIS C ANTIBODY: HCV Ab: NEGATIVE

## 2021-06-18 LAB — OB RESULTS CONSOLE ABO/RH: RH Type: POSITIVE

## 2021-06-18 LAB — OB RESULTS CONSOLE RUBELLA ANTIBODY, IGM: Rubella: IMMUNE

## 2021-06-18 LAB — OB RESULTS CONSOLE ANTIBODY SCREEN: Antibody Screen: NEGATIVE

## 2021-06-18 LAB — OB RESULTS CONSOLE GC/CHLAMYDIA
Chlamydia: NEGATIVE
Gonorrhea: NEGATIVE

## 2021-06-18 LAB — OB RESULTS CONSOLE HEPATITIS B SURFACE ANTIGEN: Hepatitis B Surface Ag: NEGATIVE

## 2021-06-18 LAB — OB RESULTS CONSOLE RPR: RPR: NONREACTIVE

## 2021-06-18 LAB — OB RESULTS CONSOLE HIV ANTIBODY (ROUTINE TESTING): HIV: NONREACTIVE

## 2021-09-05 NOTE — L&D Delivery Note (Signed)
Delivery Note Arrived to room to evaluate for cervical change. Patient was found to be complete and +1 station. Fetal head was delivered. No nuchal was present. Shoulders and body followed without difficulty. Infant was placed on mother's chest, mouth and nose were bulb suctioned and let out a vigorous cry. Delayed cord clamping was performed for 60 seconds. Cord was clamped and then cut by father of infant. Venous cord blood was collected. Placenta delivered spontaneously. TXA 1 gram was given for brisk bleeding after delivery of placenta. Cervix, vagina, perineum and labia were inspected. A second degree perineal laceration was repaired using 3-0 Vicryl. Uterus fundus firm. Placenta was inspected, found to be intact with a 3 vessel cord. Counts were correct.   At 6:05 PM a viable and healthy female was delivered via  (Presentation: ROA).  APGAR: 9, 9; weight  .   Placenta status:  Intact, Cord: 3 vessels, marginal cord insertion, Cord pH: Not collected  Anesthesia: Epidural Episiotomy:  N/A Lacerations:  Second degree Suture Repair: 3.0 vicryl Est. Blood Loss (mL):  200  Mom to postpartum.  Baby to Couplet care / Skin to Skin.  Andrea Escobar 01/20/2022, 6:27 PM

## 2021-12-23 LAB — OB RESULTS CONSOLE GBS: GBS: NEGATIVE

## 2022-01-06 ENCOUNTER — Encounter (HOSPITAL_COMMUNITY): Payer: Self-pay | Admitting: *Deleted

## 2022-01-06 ENCOUNTER — Telehealth (HOSPITAL_COMMUNITY): Payer: Self-pay | Admitting: *Deleted

## 2022-01-06 NOTE — Telephone Encounter (Signed)
Preadmission screen  

## 2022-01-16 NOTE — H&P (Signed)
HPI: 31 y.o. G2P0101 @ [redacted]w[redacted]d estimated gestational age (as dated by LMP c/w 10 week ultrasound) presents for A2DM. ? ?Leakage of fluid:  No ?Vaginal bleeding:  No ?Contractions:  No ?Fetal movement:  Yes ? ?Prenatal care has been provided by Dr. Katrinka Blazing. Connye Burkitt [facility]) ? ?ROS:  Denies fevers, chills, chest pain, visual changes, SOB, RUQ/epigastric pain, N/V, dysuria, hematuria, or sudden onset/worsening bilateral LE or facial edema. ? ?Pregnancy complicated by: ?Cervical shortening (previously using nightly Crinone gel) ?Class A2DM (Metformin 500mg  qaM AND 1000mg  qPM) ?History of PPROM/PTD at 32 weeks (previously on 17-P and then nightly vaginal progesterone) ?Sickle cell trait (FOB not tested) ?History of gestational DM ? ?Prenatal Transfer Tool  ?Maternal Diabetes: Yes:  Diabetes Type:  Insulin/Medication controlled ?Genetic Screening: Declined ?Maternal Ultrasounds/Referrals: Normal ?Fetal Ultrasounds or other Referrals:  None ?Maternal Substance Abuse:  No ?Significant Maternal Medications:  None ?Significant Maternal Lab Results: Group B Strep negative ? ? ?Prenatal Labs ?Blood type:  B Positive ?Antibody screen:  Negative ?CBC:  H/H 9.5/29.2 ?Rubella: Immune ?RPR:  Non-reactive ?Hep B:  Negative ?Hep C:  Negative ?HIV:  Negative ?GC/CT:  Negative ?Glucola:  156.7 (abnormal), 3h GTT abnormal) ? ?Immunizations: ?Tdap: Given prenatally ?Flu: Received 06/2021 ? ?OBHx:  ?OB History   ? ? Gravida  ?2  ? Para  ?1  ? Term  ?   ? Preterm  ?1  ? AB  ?   ? Living  ?1  ?  ? ? SAB  ?   ? IAB  ?   ? Ectopic  ?   ? Multiple  ?0  ? Live Births  ?1  ?   ?  ?  ? ?PMHx:  See above ?Meds:  PNV, Metformin ?Allergy:  No Known Allergies ?SurgHx: None ?SocHx:   Denies Tobacco, ETOH, illicit drugs ? ?O: There were no vitals taken for this visit. ?Gen. AAOx3, NAD ?CV.  RRR  ?Resp. CTAB, no wheezes/rales/rhonchi ?Abd. Gravid, soft, non-tender throughout, no rebound/guarding ?Extr.  No bilateral LE edema, no calf tenderness  bilaterally ?SVE: 2-3/80/-3 ? ?Last 07/2021:  12/16/2021 [redacted]w[redacted]d EFW 2796g, 6 lbs 3 oz (76%), AAFV, Transverse, anterior placenta ? ?Labs: see orders ? ?A/P:  31 y.o. G2P0101 @ [redacted]w[redacted]d who presents for induction of labor for A2DM. ? ?IOL - Class A2DM ?- Admit to L&D ?- Admit labs (CBC, T&S, COVID screen per protocol) ?- CEFM/Toco ?- Diet:  Clear liquids ?- IVF:  LR at 125cc/hour ?- VTE Prophylaxis:  SCDs ?- GBS Status:  Negative ?- Presentation:  Confirm prior to IOL ?- Pain control:  Per patient request ?- Induction method:  Pitocin 2x2 ?- Anticipate SVD ? ?Class A2DM ?- Medication: Metformin 500mg  qAM and 1000mg  qPM (held) ?- CBG q4 hours with SSI ? ? ?37, DO ?[phone] (office) ? ? ? ? ? ?

## 2022-01-20 ENCOUNTER — Encounter (HOSPITAL_COMMUNITY): Payer: Self-pay | Admitting: Obstetrics and Gynecology

## 2022-01-20 ENCOUNTER — Inpatient Hospital Stay (HOSPITAL_COMMUNITY): Payer: Medicaid Other | Admitting: Anesthesiology

## 2022-01-20 ENCOUNTER — Inpatient Hospital Stay (HOSPITAL_COMMUNITY)
Admission: AD | Admit: 2022-01-20 | Discharge: 2022-01-22 | DRG: 807 | Disposition: A | Discharge status: Home / Self Care | Payer: Medicaid Other | Attending: Obstetrics and Gynecology | Admitting: Obstetrics and Gynecology

## 2022-01-20 ENCOUNTER — Inpatient Hospital Stay (HOSPITAL_COMMUNITY): Payer: Medicaid Other

## 2022-01-20 ENCOUNTER — Other Ambulatory Visit: Payer: Self-pay

## 2022-01-20 DIAGNOSIS — Z3A39 39 weeks gestation of pregnancy: Secondary | ICD-10-CM

## 2022-01-20 DIAGNOSIS — D573 Sickle-cell trait: Secondary | ICD-10-CM | POA: Diagnosis present

## 2022-01-20 DIAGNOSIS — O9902 Anemia complicating childbirth: Secondary | ICD-10-CM | POA: Diagnosis present

## 2022-01-20 DIAGNOSIS — O43123 Velamentous insertion of umbilical cord, third trimester: Secondary | ICD-10-CM | POA: Diagnosis present

## 2022-01-20 DIAGNOSIS — O24425 Gestational diabetes mellitus in childbirth, controlled by oral hypoglycemic drugs: Principal | ICD-10-CM | POA: Diagnosis present

## 2022-01-20 DIAGNOSIS — Z3490 Encounter for supervision of normal pregnancy, unspecified, unspecified trimester: Secondary | ICD-10-CM | POA: Diagnosis present

## 2022-01-20 LAB — GLUCOSE, CAPILLARY
Glucose-Capillary: 79 mg/dL (ref 70–99)
Glucose-Capillary: 113 mg/dL — ABNORMAL HIGH (ref 70–99)
Glucose-Capillary: 69 mg/dL — ABNORMAL LOW (ref 70–99)
Glucose-Capillary: 84 mg/dL (ref 70–99)

## 2022-01-20 LAB — CBC
HCT: 32.9 % — ABNORMAL LOW (ref 36.0–46.0)
Hemoglobin: 10.6 g/dL — ABNORMAL LOW (ref 12.0–15.0)
MCH: 24.5 pg — ABNORMAL LOW (ref 26.0–34.0)
MCHC: 32.2 g/dL (ref 30.0–36.0)
MCV: 76.2 fL — ABNORMAL LOW (ref 80.0–100.0)
Platelets: 171 10*3/uL (ref 150–400)
RBC: 4.32 MIL/uL (ref 3.87–5.11)
RDW: 17 % — ABNORMAL HIGH (ref 11.5–15.5)
WBC: 9.8 10*3/uL (ref 4.0–10.5)
nRBC: 0 % (ref 0.0–0.2)

## 2022-01-20 LAB — TYPE AND SCREEN
ABO/RH(D): B POS
Antibody Screen: NEGATIVE

## 2022-01-20 LAB — RPR: RPR Ser Ql: NONREACTIVE

## 2022-01-20 MED ORDER — LIDOCAINE HCL (PF) 1 % IJ SOLN
INTRAMUSCULAR | Status: DC | PRN
Start: 2022-01-20 — End: 2022-01-20
  Administered 2022-01-20: 11 mL via EPIDURAL

## 2022-01-20 MED ORDER — EPHEDRINE 5 MG/ML INJ: 10.0000 mg | INTRAVENOUS | Status: DC | PRN

## 2022-01-20 MED ORDER — TRANEXAMIC ACID-NACL 1000-0.7 MG/100ML-% IV SOLN: 1000.0000 mg | Freq: Once | INTRAVENOUS | Status: DC

## 2022-01-20 MED ORDER — INSULIN ASPART 100 UNIT/ML IJ SOLN
0.0000 [IU] | INTRAMUSCULAR | Status: DC
  Administered 2022-01-20: 1 [IU] via SUBCUTANEOUS

## 2022-01-20 MED ORDER — DIBUCAINE (PERIANAL) 1 % EX OINT: 1.0000 "application " | TOPICAL_OINTMENT | CUTANEOUS | Status: DC | PRN

## 2022-01-20 MED ORDER — DIPHENHYDRAMINE HCL 25 MG PO CAPS: 25.0000 mg | ORAL_CAPSULE | Freq: Four times a day (QID) | ORAL | Status: DC | PRN

## 2022-01-20 MED ORDER — PHENYLEPHRINE 80 MCG/ML (10ML) SYRINGE FOR IV PUSH (FOR BLOOD PRESSURE SUPPORT): 80.0000 ug | PREFILLED_SYRINGE | INTRAVENOUS | Status: DC | PRN

## 2022-01-20 MED ORDER — BENZOCAINE-MENTHOL 20-0.5 % EX AERO
1.0000 "application " | INHALATION_SPRAY | CUTANEOUS | Status: DC | PRN
  Administered 2022-01-21 (×2): 1 via TOPICAL
  Filled 2022-01-20 (×2): qty 56

## 2022-01-20 MED ORDER — ZOLPIDEM TARTRATE 5 MG PO TABS: 5.0000 mg | ORAL_TABLET | Freq: Every evening | ORAL | Status: DC | PRN

## 2022-01-20 MED ORDER — DIPHENHYDRAMINE HCL 50 MG/ML IJ SOLN: 12.5000 mg | INTRAMUSCULAR | Status: DC | PRN

## 2022-01-20 MED ORDER — OXYTOCIN BOLUS FROM INFUSION
333.0000 mL | Freq: Once | INTRAVENOUS | Status: AC
  Administered 2022-01-20: 333 mL via INTRAVENOUS

## 2022-01-20 MED ORDER — FENTANYL-BUPIVACAINE-NACL 0.5-0.125-0.9 MG/250ML-% EP SOLN
12.0000 mL/h | EPIDURAL | Status: DC | PRN
  Administered 2022-01-20: 12 mL/h via EPIDURAL
  Filled 2022-01-20: qty 250

## 2022-01-20 MED ORDER — HYDROXYZINE HCL 50 MG PO TABS: 50.0000 mg | ORAL_TABLET | Freq: Four times a day (QID) | ORAL | Status: DC | PRN

## 2022-01-20 MED ORDER — LACTATED RINGERS IV SOLN: 500.0000 mL | Freq: Once | INTRAVENOUS | Status: DC

## 2022-01-20 MED ORDER — OXYCODONE-ACETAMINOPHEN 5-325 MG PO TABS: 1.0000 | ORAL_TABLET | ORAL | Status: DC | PRN

## 2022-01-20 MED ORDER — OXYTOCIN-SODIUM CHLORIDE 30-0.9 UT/500ML-% IV SOLN
2.5000 [IU]/h | INTRAVENOUS | Status: DC
  Administered 2022-01-20: 2.5 [IU]/h via INTRAVENOUS

## 2022-01-20 MED ORDER — OXYTOCIN-SODIUM CHLORIDE 30-0.9 UT/500ML-% IV SOLN
1.0000 m[IU]/min | INTRAVENOUS | Status: DC
  Administered 2022-01-20: 2 m[IU]/min via INTRAVENOUS
  Filled 2022-01-20: qty 500

## 2022-01-20 MED ORDER — TERBUTALINE SULFATE 1 MG/ML IJ SOLN: 0.2500 mg | Freq: Once | INTRAMUSCULAR | Status: DC | PRN

## 2022-01-20 MED ORDER — FERROUS SULFATE 325 (65 FE) MG PO TABS
325.0000 mg | ORAL_TABLET | Freq: Two times a day (BID) | ORAL | Status: DC
  Administered 2022-01-21: 325 mg via ORAL
  Filled 2022-01-20: qty 1

## 2022-01-20 MED ORDER — ACETAMINOPHEN 325 MG PO TABS: 650.0000 mg | ORAL_TABLET | ORAL | Status: DC | PRN

## 2022-01-20 MED ORDER — ONDANSETRON HCL 4 MG/2ML IJ SOLN: 4.0000 mg | Freq: Four times a day (QID) | INTRAMUSCULAR | Status: DC | PRN

## 2022-01-20 MED ORDER — LACTATED RINGERS IV SOLN: INTRAVENOUS | Status: DC

## 2022-01-20 MED ORDER — LIDOCAINE HCL (PF) 1 % IJ SOLN: 30.0000 mL | INTRAMUSCULAR | Status: DC | PRN

## 2022-01-20 MED ORDER — SIMETHICONE 80 MG PO CHEW: 80.0000 mg | CHEWABLE_TABLET | ORAL | Status: DC | PRN

## 2022-01-20 MED ORDER — SENNOSIDES-DOCUSATE SODIUM 8.6-50 MG PO TABS
2.0000 | ORAL_TABLET | Freq: Every day | ORAL | Status: DC
  Administered 2022-01-21: 2 via ORAL
  Filled 2022-01-20 (×2): qty 2

## 2022-01-20 MED ORDER — ONDANSETRON HCL 4 MG PO TABS: 4.0000 mg | ORAL_TABLET | ORAL | Status: DC | PRN

## 2022-01-20 MED ORDER — OXYCODONE-ACETAMINOPHEN 5-325 MG PO TABS: 2.0000 | ORAL_TABLET | ORAL | Status: DC | PRN

## 2022-01-20 MED ORDER — WITCH HAZEL-GLYCERIN EX PADS: 1.0000 "application " | MEDICATED_PAD | CUTANEOUS | Status: DC | PRN

## 2022-01-20 MED ORDER — LACTATED RINGERS IV SOLN: 500.0000 mL | INTRAVENOUS | Status: DC | PRN

## 2022-01-20 MED ORDER — COCONUT OIL OIL: 1.0000 "application " | TOPICAL_OIL | Status: DC | PRN

## 2022-01-20 MED ORDER — SOD CITRATE-CITRIC ACID 500-334 MG/5ML PO SOLN: 30.0000 mL | ORAL | Status: DC | PRN

## 2022-01-20 MED ORDER — TRANEXAMIC ACID-NACL 1000-0.7 MG/100ML-% IV SOLN
INTRAVENOUS | Status: AC
Start: 2022-01-20 — End: 2022-01-20
  Administered 2022-01-20: 1000 mg
  Filled 2022-01-20: qty 100

## 2022-01-20 MED ORDER — FENTANYL CITRATE (PF) 100 MCG/2ML IJ SOLN: 50.0000 ug | INTRAMUSCULAR | Status: DC | PRN

## 2022-01-20 MED ORDER — ONDANSETRON HCL 4 MG/2ML IJ SOLN: 4.0000 mg | INTRAMUSCULAR | Status: DC | PRN

## 2022-01-20 MED ORDER — PRENATAL MULTIVITAMIN CH
1.0000 | ORAL_TABLET | Freq: Every day | ORAL | Status: DC
  Administered 2022-01-21 – 2022-01-22 (×2): 1 via ORAL
  Filled 2022-01-20 (×2): qty 1

## 2022-01-20 MED ORDER — ACETAMINOPHEN 325 MG PO TABS
650.0000 mg | ORAL_TABLET | ORAL | Status: DC | PRN
  Administered 2022-01-21: 650 mg via ORAL
  Filled 2022-01-20: qty 2

## 2022-01-20 MED ORDER — IBUPROFEN 600 MG PO TABS
600.0000 mg | ORAL_TABLET | Freq: Four times a day (QID) | ORAL | Status: DC
  Administered 2022-01-20 – 2022-01-22 (×7): 600 mg via ORAL
  Filled 2022-01-20 (×7): qty 1

## 2022-01-20 MED ORDER — FENTANYL-BUPIVACAINE-NACL 0.5-0.125-0.9 MG/250ML-% EP SOLN: 12.0000 mL/h | EPIDURAL | Status: DC | PRN

## 2022-01-20 NOTE — Anesthesia Preprocedure Evaluation (Signed)
Anesthesia Evaluation  Patient identified by MRN, date of birth, ID band Patient awake    Reviewed: Allergy & Precautions, H&P , NPO status , Patient's Chart, lab work & pertinent test results  Airway Mallampati: I  TM Distance: >3 FB Neck ROM: full    Dental no notable dental hx. (+) Teeth Intact   Pulmonary neg pulmonary ROS,    Pulmonary exam normal breath sounds clear to auscultation       Cardiovascular negative cardio ROS Normal cardiovascular exam Rhythm:regular Rate:Normal     Neuro/Psych negative neurological ROS  negative psych ROS   GI/Hepatic negative GI ROS, Neg liver ROS,   Endo/Other  negative endocrine ROS  Renal/GU negative Renal ROS  negative genitourinary   Musculoskeletal negative musculoskeletal ROS (+)   Abdominal   Peds  Hematology  (+) Blood dyscrasia, anemia ,   Anesthesia Other Findings   Reproductive/Obstetrics (+) Pregnancy                             Anesthesia Physical Anesthesia Plan  ASA: II  Anesthesia Plan: Epidural   Post-op Pain Management:    Induction:   PONV Risk Score and Plan:   Airway Management Planned:   Additional Equipment:   Intra-op Plan:   Post-operative Plan:   Informed Consent: I have reviewed the patients History and Physical, chart, labs and discussed the procedure including the risks, benefits and alternatives for the proposed anesthesia with the patient or authorized representative who has indicated his/her understanding and acceptance.       Plan Discussed with:   Anesthesia Plan Comments:         Anesthesia Quick Evaluation  

## 2022-01-20 NOTE — Progress Notes (Signed)
OB Progress Note  S: Patient doing well. Contractions are not very painful. Consents to AROM.  O: BP 108/63   Pulse 76   Temp 97.9 F (36.6 C) (Oral)   Resp 17   Ht 5\' 5"  (1.651 m)   Wt 102.1 kg   BMI 37.46 kg/m   FHT: 135bpm, moderate variablity, + accels, - decels Toco: not graphing well SVE: 6/80/-3, sutures palpated AROM: clear, non-odorous  A/P: 31 y.o. G2P0101 @ [redacted]w[redacted]d admitted for induction of labor for Class A2DM.  FWB: Cat. I Labor course: Pitocin at 23mU/min, AROM performed now Pain: Desires epidural GBS: Negative Anticipate SVD   9m, DO

## 2022-01-20 NOTE — Anesthesia Procedure Notes (Signed)
Epidural Patient location during procedure: OB Start time: 01/20/2022 1:47 PM End time: 01/20/2022 2:04 PM  Staffing Anesthesiologist: Lowella Curb, MD Performed: anesthesiologist   Preanesthetic Checklist Completed: patient identified, IV checked, site marked, risks and benefits discussed, surgical consent, monitors and equipment checked, pre-op evaluation and timeout performed  Epidural Patient position: sitting Prep: ChloraPrep Patient monitoring: heart rate, cardiac monitor, continuous pulse ox and blood pressure Approach: midline Location: L2-L3 Injection technique: LOR saline  Needle:  Needle type: Tuohy  Needle gauge: 17 G Needle length: 9 cm Needle insertion depth: 6 cm Catheter type: closed end flexible Catheter size: 20 Guage Catheter at skin depth: 10 cm Test dose: negative  Assessment Events: blood not aspirated, injection not painful, no injection resistance, no paresthesia and negative IV test  Additional Notes Epidural placed by SRNA under direct supervisionReason for block:procedure for pain

## 2022-01-20 NOTE — Progress Notes (Signed)
OB Progress Note  S: Patient resting comfortably. She and her husband are needing to arrange childcare for their young daughter. She asks about potentially being discharged home and the ability to return in active labor.  O: BP 112/69   Pulse 82   Temp 98.3 F (36.8 C) (Oral)   Resp 18   Ht 5\' 5"  (1.651 m)   Wt 102.1 kg   BMI 37.46 kg/m   FHT: 135bpm, moderate variablity, + accels, - decels Toco: occasional SVE: 0000000, cephalic  A/P: 31 y.o. [id number] @ [redacted]w[redacted]d admitted for induction of labor for Class A2DM.  FWB: Cat. I Labor course: Will initiate Pitocin 2x2 Pain: Per patient request, desires epidural GBS: Negative Anticipate SVD  We discussed indication/recommendation to proceed with delivery due to A2DM. Patient very agreeable and understanding to proceed. Desires a meal prior to initiation of IOL.  Drema Dallas, DO

## 2022-01-20 NOTE — Lactation Note (Signed)
This note was copied from a baby's chart. Lactation Consultation Note Mom declined LC services.  Patient Name: Andrea Escobar [id number] Date: 01/20/2022   Age:29 hours  Maternal Data    Feeding    LATCH Score Latch: Repeated attempts needed to sustain latch, nipple held in mouth throughout feeding, stimulation needed to elicit sucking reflex.  Audible Swallowing: A few with stimulation  Type of Nipple: Everted at rest and after stimulation  Comfort (Breast/Nipple): Soft / non-tender  Hold (Positioning): No assistance needed to correctly position infant at breast.  LATCH Score: 8   Lactation Tools Discussed/Used    Interventions    Discharge    Consult Status Consult Status: Complete    [name] G 01/20/2022, 7:30 PM

## 2022-01-21 LAB — CBC
HCT: 31.4 % — ABNORMAL LOW (ref 36.0–46.0)
Hemoglobin: 9.9 g/dL — ABNORMAL LOW (ref 12.0–15.0)
MCH: 24.1 pg — ABNORMAL LOW (ref 26.0–34.0)
MCHC: 31.5 g/dL (ref 30.0–36.0)
MCV: 76.6 fL — ABNORMAL LOW (ref 80.0–100.0)
Platelets: 169 10*3/uL (ref 150–400)
RBC: 4.1 MIL/uL (ref 3.87–5.11)
RDW: 17.2 % — ABNORMAL HIGH (ref 11.5–15.5)
WBC: 10.8 10*3/uL — ABNORMAL HIGH (ref 4.0–10.5)
nRBC: 0 % (ref 0.0–0.2)

## 2022-01-21 LAB — GLUCOSE, CAPILLARY: Glucose-Capillary: 129 mg/dL — ABNORMAL HIGH (ref 70–99)

## 2022-01-21 MED ORDER — OXYCODONE HCL 5 MG PO TABS
5.0000 mg | ORAL_TABLET | Freq: Four times a day (QID) | ORAL | Status: DC | PRN
  Administered 2022-01-21: 5 mg via ORAL
  Filled 2022-01-21 (×2): qty 1

## 2022-01-21 NOTE — Progress Notes (Signed)
Post Partum Day 1 Subjective: no complaints, up ad lib, voiding, tolerating PO, and + flatus  Objective: Blood pressure 106/63, pulse 71, temperature 98 F (36.7 C), temperature source Oral, resp. rate 16, height 5\' 5"  (1.651 m), weight 102.1 kg, SpO2 100 %, unknown if currently breastfeeding.  Physical Exam:  General: alert, cooperative, and no distress Lochia: appropriate Uterine Fundus: firm Incision: NA DVT Evaluation: No evidence of DVT seen on physical exam.  Recent Labs    01/20/22 0823 01/21/22 0511  HGB 10.6* 9.9*  HCT 32.9* 31.4*    Assessment/Plan: Plan for discharge tomorrow and Breastfeeding Routine postpartum care    LOS: 1 day   01/23/22 01/21/2022, 6:20 PM

## 2022-01-21 NOTE — Anesthesia Postprocedure Evaluation (Signed)
Anesthesia Post Note  Patient: [place] Llewelyn  Procedure(s) Performed: AN AD [place]     Patient location during evaluation: Mother Baby Anesthesia Type: Epidural Level of consciousness: awake and alert Pain management: pain level controlled Vital Signs Assessment: post-procedure vital signs reviewed and stable Respiratory status: spontaneous breathing, nonlabored ventilation and respiratory function stable Cardiovascular status: stable Postop Assessment: no headache, no backache and epidural receding Anesthetic complications: no   No notable events documented.  Last Vitals:  Vitals:   01/20/22 2151 01/21/22 0142  BP: 104/78 104/68  Pulse: 82 80  Resp: 18 20  Temp: 36.6 C (!) 36.3 C  SpO2: 100% 100%    Last Pain:  Vitals:   01/21/22 0309  TempSrc:   PainSc: 3    Pain Goal:                Epidural/Spinal Function Cutaneous sensation: Normal sensation (01/21/22 0309), Patient able to flex knees: Yes (01/21/22 0309), Patient able to lift hips off bed: Yes (01/21/22 0309), Back pain beyond tenderness at insertion site: No (01/21/22 0309), Progressively worsening motor and/or sensory loss: No (01/21/22 0309), Bowel and/or bladder incontinence post epidural: No (01/21/22 0309)  Garrus Gauthreaux

## 2022-01-22 MED ORDER — IBUPROFEN 600 MG PO TABS: 600.0000 mg | ORAL_TABLET | Freq: Four times a day (QID) | ORAL | 3 refills | Status: AC | PRN

## 2022-01-22 MED ORDER — ACETAMINOPHEN 325 MG PO TABS: 650.0000 mg | ORAL_TABLET | ORAL | 3 refills | Status: AC | PRN

## 2022-01-22 MED ORDER — SENNOSIDES-DOCUSATE SODIUM 8.6-50 MG PO TABS: 2.0000 | ORAL_TABLET | Freq: Two times a day (BID) | ORAL | 3 refills | Status: AC | PRN

## 2022-01-22 NOTE — Lactation Note (Signed)
This note was copied from a baby's chart. Lactation Consultation Note  Patient Name: Andrea Escobar [id number] Date: 01/22/2022 Reason for consult: Follow-up assessment Age:29 hours   Lactation Follow Up Consult:  Mother had a few questions which I answered to her satisfaction.  Mother is aware that she can call as needed after discharge for any further concerns.   Maternal Data    Feeding Nipple Type: Slow - flow  LATCH Score                    Lactation Tools Discussed/Used    Interventions    Discharge Discharge Education: Engorgement and breast care  Consult Status Consult Status: Complete    Mica Releford R Katrese Shell 01/22/2022, 9:22 AM

## 2022-01-22 NOTE — Discharge Summary (Signed)
   Postpartum Discharge Summary  Date of Service updated 01/22/2022      Patient Name: Andrea Escobar DOB: 03/17/1991 MRN: [id number]  Date of admission: 01/20/2022 Delivery date:01/20/2022  Delivering provider: DAVIES, MELISSA  Date of discharge: 01/22/2022  Admitting diagnosis: Encounter for induction of labor [Z34.90] Intrauterine pregnancy: [redacted]w[redacted]d     Secondary diagnosis:  A2 GDM  Additional problems: none    Discharge diagnosis: Term Pregnancy Delivered                                              Post partum procedures: none Augmentation: AROM and Pitocin Complications: None  Hospital course: Induction of Labor With Vaginal Delivery   31 y.o. yo G2P1102 at [redacted]w[redacted]d was admitted to the hospital 01/20/2022 for induction of labor.  Indication for induction: A2 DM.  Patient had an uncomplicated labor course as follows: Membrane Rupture Time/Date: 1:06 PM ,01/20/2022   Delivery Method:Vaginal, Spontaneous  Episiotomy: None  Lacerations:  2nd degree  Details of delivery can be found in separate delivery note.  Patient had a routine postpartum course. Patient is discharged home 01/22/22.  Newborn Data: Birth date:01/20/2022  Birth time:6:05 PM  Gender:Female  Living status:Living  Apgars:9 ,9  Weight:3090 g   Magnesium Sulfate received: No BMZ received: No Rhophylac:N/A MMR:No T-DaP:Given prenatally Flu: No Transfusion:No  Physical exam  Vitals:   01/21/22 0945 01/21/22 1550 01/21/22 2108 01/22/22 0553  BP: 104/66 106/63 112/70 97/65  Pulse: 81 71 89 75  Resp: 17 16  18  Temp: 98 F (36.7 C) 98 F (36.7 C) 98.5 F (36.9 C) 98.2 F (36.8 C)  TempSrc: Oral Oral Oral Oral  SpO2: 100% 100%    Weight:      Height:       General: alert, cooperative, and no distress Lochia: appropriate Uterine Fundus: firm Incision: N/A DVT Evaluation: No evidence of DVT seen on physical exam. Negative Homan's sign. Labs: Lab Results  Component Value Date   WBC 10.8 (H)  01/21/2022   HGB 9.9 (L) 01/21/2022   HCT 31.4 (L) 01/21/2022   MCV 76.6 (L) 01/21/2022   PLT 169 01/21/2022       View : No data to display.         Edinburgh Score:    01/21/2022   11:29 AM  Edinburgh Postnatal Depression Scale Screening Tool  I have been able to laugh and see the funny side of things. 0  I have looked forward with enjoyment to things. 0  I have blamed myself unnecessarily when things went wrong. 0  I have been anxious or worried for no good reason. 0  I have felt scared or panicky for no good reason. 0  Things have been getting on top of me. 0  I have been so unhappy that I have had difficulty sleeping. 0  I have felt sad or miserable. 0  I have been so unhappy that I have been crying. 0  The thought of harming myself has occurred to me. 0  Edinburgh Postnatal Depression Scale Total 0      After visit meds:  Allergies as of 01/22/2022   No Known Allergies      Medication List     STOP taking these medications    docusate sodium 100 MG capsule Commonly known as: Colace   ferrous gluconate 324 MG tablet   Commonly known as: FERGON       TAKE these medications    acetaminophen 325 MG tablet Commonly known as: Tylenol Take 2 tablets (650 mg total) by mouth every 4 (four) hours as needed for moderate pain (for pain scale < 4).   ibuprofen 600 MG tablet Commonly known as: ADVIL Take 1 tablet (600 mg total) by mouth every 6 (six) hours as needed for cramping. What changed:  when to take this reasons to take this   prenatal multivitamin Tabs tablet Take 1 tablet by mouth daily at 12 noon.   senna-docusate 8.6-50 MG tablet Commonly known as: Senokot-S Take 2 tablets by mouth every 12 (twelve) hours as needed for mild constipation.         Discharge home in stable condition Infant Feeding: Bottle and Breast Infant Disposition:home with mother Discharge instruction: per After Visit Summary and Postpartum booklet. Activity: Advance  as tolerated. Pelvic rest for 6 weeks.  Diet: carb modified diet Anticipated Birth Control:  not discussed Postpartum Appointment:6 weeks Additional Postpartum F/U: 2 hour GTT Future Appointments:No future appointments. Follow up Visit:      01/22/2022 Walda Pinn, MD   

## 2022-01-24 LAB — SURGICAL PATHOLOGY

## 2022-01-24 NOTE — Addendum Note (Signed)
Addendum  created 01/24/22 2308 by Janeece Riggers, MD   Intraprocedure Staff edited

## 2022-01-29 ENCOUNTER — Telehealth (HOSPITAL_COMMUNITY): Payer: Self-pay

## 2022-01-29 NOTE — Telephone Encounter (Signed)
"  Everything went well so far, I'm fine. One of my breasts has a tendency to do stones even if child is sucking it hurts." RN provided education about engorgement and mastitis. RN reviewed [facility] LC resources. RN encouraged patient to utilize these resources. RN told patient to call her OB-GYN if she has symptoms or mastitis that RN has reviewed with her. Patient has no other questions or concerns about her healing.  "She's doing well. Eating and gaining weight. She sleeps in a crib, she also has a bassinet." RN reviewed ABC's of safe sleep with patient. Patient declines any questions or concerns about baby.  EPDS score is 0.  Andrea Escobar [facility] 05/27//2023,0907

## 2023-06-02 ENCOUNTER — Other Ambulatory Visit: Payer: Self-pay | Admitting: Internal Medicine

## 2023-06-02 ENCOUNTER — Ambulatory Visit
Admission: RE | Admit: 2023-06-02 | Discharge: 2023-06-02 | Disposition: A | Discharge status: Home / Self Care | Payer: Medicaid Other | Source: Ambulatory Visit | Attending: Internal Medicine | Admitting: Internal Medicine

## 2023-06-02 DIAGNOSIS — M549 Dorsalgia, unspecified: Secondary | ICD-10-CM

## 2023-10-11 ENCOUNTER — Telehealth: Payer: Self-pay | Admitting: Oncology

## 2023-10-11 NOTE — Telephone Encounter (Signed)
 Rescheduled appointments per patients request via incoming call. Patient is aware of the changes made to her upcoming appointments.

## 2023-10-14 ENCOUNTER — Other Ambulatory Visit: Payer: Medicaid Other

## 2023-10-14 ENCOUNTER — Encounter: Payer: Medicaid Other | Admitting: Hematology

## 2023-10-26 ENCOUNTER — Inpatient Hospital Stay: Payer: Medicaid Other | Attending: Oncology | Admitting: Oncology

## 2023-10-26 ENCOUNTER — Encounter: Payer: Self-pay | Admitting: Oncology

## 2023-10-26 ENCOUNTER — Inpatient Hospital Stay: Payer: Medicaid Other

## 2023-10-26 VITALS — BP 111/90 | HR 84 | Temp 97.0°F | Resp 18 | Ht 67.0 in | Wt 198.7 lb

## 2023-10-26 DIAGNOSIS — D509 Iron deficiency anemia, unspecified: Secondary | ICD-10-CM

## 2023-10-26 LAB — FOLATE: Folate: 19.8 ng/mL (ref >5.9)

## 2023-10-26 LAB — CBC WITH DIFFERENTIAL (CANCER CENTER ONLY)
Abs Immature Granulocytes: 0.01 10*3/uL (ref 0.00–0.07)
Basophils Absolute: 0.1 10*3/uL (ref 0.0–0.1)
Basophils Relative: 1 %
Eosinophils Absolute: 0.2 10*3/uL (ref 0.0–0.5)
Eosinophils Relative: 3 %
HCT: 39.4 % (ref 36.0–46.0)
Hemoglobin: 12.2 g/dL (ref 12.0–15.0)
Immature Granulocytes: 0 %
Lymphocytes Relative: 35 %
Lymphs Abs: 1.8 10*3/uL (ref 0.7–4.0)
MCH: 23.4 pg — ABNORMAL LOW (ref 26.0–34.0)
MCHC: 31 g/dL (ref 30.0–36.0)
MCV: 75.6 fL — ABNORMAL LOW (ref 80.0–100.0)
Monocytes Absolute: 0.3 10*3/uL (ref 0.1–1.0)
Monocytes Relative: 6 %
Neutro Abs: 2.9 10*3/uL (ref 1.7–7.7)
Neutrophils Relative %: 55 %
Platelet Count: 253 10*3/uL (ref 150–400)
RBC: 5.21 MIL/uL — ABNORMAL HIGH (ref 3.87–5.11)
RDW: 16.3 % — ABNORMAL HIGH (ref 11.5–15.5)
WBC Count: 5.3 10*3/uL (ref 4.0–10.5)
nRBC: 0 % (ref 0.0–0.2)

## 2023-10-26 LAB — CMP (CANCER CENTER ONLY)
ALT: 14 U/L (ref 0–44)
AST: 14 U/L — ABNORMAL LOW (ref 15–41)
Albumin: 4.6 g/dL (ref 3.5–5.0)
Alkaline Phosphatase: 52 U/L (ref 38–126)
Anion gap: 5 (ref 5–15)
BUN: 11 mg/dL (ref 6–20)
CO2: 27 mmol/L (ref 22–32)
Calcium: 9.6 mg/dL (ref 8.9–10.3)
Chloride: 107 mmol/L (ref 98–111)
Creatinine: 0.66 mg/dL (ref 0.44–1.00)
GFR, Estimated: >60 mL/min (ref >60)
Glucose, Bld: 100 mg/dL — ABNORMAL HIGH (ref 70–99)
Potassium: 4 mmol/L (ref 3.5–5.1)
Sodium: 139 mmol/L (ref 135–145)
Total Bilirubin: 0.4 mg/dL (ref 0.0–1.2)
Total Protein: 7.9 g/dL (ref 6.5–8.1)

## 2023-10-26 LAB — FERRITIN: Ferritin: 6 ng/mL — ABNORMAL LOW (ref 11–307)

## 2023-10-26 LAB — IRON AND IRON BINDING CAPACITY (CC-WL,HP ONLY)
Iron: 113 ug/dL (ref 28–170)
Saturation Ratios: 23 % (ref 10.4–31.8)
TIBC: 493 ug/dL — ABNORMAL HIGH (ref 250–450)
UIBC: 380 ug/dL (ref 148–442)

## 2023-10-26 LAB — LACTATE DEHYDROGENASE: LDH: 117 U/L (ref 98–192)

## 2023-10-26 NOTE — Assessment & Plan Note (Addendum)
 Hemoglobin level of 10.1 g/dL noted post-miscarriage in January 2025. No history of menorrhagia, other bleeding episodes.  Potential thalassemia trait based on chart review, which could be contributing to low hemoglobin levels.   Labs today showed normal hemoglobin of 12.2, hematocrit 39.4.  MCV remains low at 75.6.  White count 5,300 with normal differential.  Platelet count normal at 253,000.  CMP, LDH, folate, iron studies are all unremarkable.  No evidence of iron deficiency noted currently.  Ferritin pending.  Given persistent microcytosis in the context of normal iron studies, we will obtain hemoglobin electrophoresis and alpha thalassemia testing to rule out thalassemia trait or any other hemoglobinopathies.  Patient prefers to receive results via MyChart.  I will communicate to her, once results are available.  I currently do not see the need for IV iron supplementation.  She can consider taking multivitamin with iron.   - Follow-up in three months with repeat labs.  -I will send results and recommendations via MyChart once all results are available.

## 2023-10-26 NOTE — Progress Notes (Signed)
 [facility]  HEMATOLOGY CLINIC CONSULTATION NOTE   PATIENT NAME: Andrea Escobar   MR#: [id number] DOB: [birth year]  DATE OF SERVICE: 10/26/2023  Patient Care Team: Georgann Housekeeper, MD as PCP - General (Internal Medicine)  REASON FOR CONSULTATION/ CHIEF COMPLAINT:  Evaluation of anemia.  ASSESSMENT & PLAN:   Andrea Escobar is a 33 y.o. lady with a past medical history of gestational diabetes, vitamin D deficiency, was referred to our service for evaluation of microcytic anemia.    Microcytic anemia Hemoglobin level of 10.1 g/dL noted post-miscarriage in January 2025. No history of menorrhagia, other bleeding episodes.  Potential thalassemia trait based on chart review, which could be contributing to low hemoglobin levels.   Labs today showed normal hemoglobin of 12.2, hematocrit 39.4.  MCV remains low at 75.6.  White count 5,300 with normal differential.  Platelet count normal at 253,000.  CMP, LDH, folate, iron studies are all unremarkable.  No evidence of iron deficiency noted currently.  Ferritin pending.  Given persistent microcytosis in the context of normal iron studies, we will obtain hemoglobin electrophoresis and alpha thalassemia testing to rule out thalassemia trait or any other hemoglobinopathies.  Patient prefers to receive results via MyChart.  I will communicate to her, once results are available.  I currently do not see the need for IV iron supplementation.  She can consider taking multivitamin with iron.   - Follow-up in three months with repeat labs.  -I will send results and recommendations via MyChart once all results are available.     I reviewed lab results and outside records for this visit and discussed relevant results with the patient. Diagnosis, plan of care and treatment options were also discussed in detail with the patient. Opportunity provided to ask questions and answers provided to her apparent satisfaction. Provided instructions to  call our clinic with any problems, questions or concerns prior to return visit. I recommended to continue follow-up with PCP and sub-specialists. She verbalized understanding and agreed with the plan. No barriers to learning was detected.  Meryl Crutch, MD [facility] - A DEPT [facility] [address] [place] [address] Dept: [phone] Dept Fax: [phone]  10/26/2023 1:04 PM  HISTORY OF PRESENT ILLNESS:  Discussed the use of AI scribe software for clinical note transcription with the patient, who gave verbal consent to proceed.   On 09/22/2023, CBC showed hemoglobin of 10.1, hematocrit 31.2, MCV 76.  White count 6100.  Platelet count normal at 300,000.  CMP unremarkable.  B12 normal at 916 pg/mL.  TSH normal.  On review of our records, she has had chronic microcytic anemia dating back to March 2021 and MCV has been in the range of 75-78, hemoglobin ranging between 9-10.6.  The patient has never been on iron supplements or multivitamins.  She was not aware of her anemia previously.  The patient had a recent miscarriage in January 2025 around the time the low hemoglobin was detected. The patient believes the anemia is normal due to the recent miscarriage. The patient denies any significant blood loss otherwise and denies heavy menstrual cycles, or any other bleeding such as nose bleeds or gum bleeds. The patient's chart review indicates a possible thalassemia trait, which the patient was not aware of.  She denies recent chest pain on exertion, shortness of breath on minimal exertion, pre-syncopal episodes, or palpitations.   MEDICAL HISTORY:  Past Medical History:  Diagnosis Date  Gestational diabetes mellitus    Vitamin D deficiency     SURGICAL HISTORY: History reviewed. No pertinent surgical history.  SOCIAL HISTORY: She reports that she has never smoked. She has never used smokeless tobacco. She  reports that she does not currently use alcohol. She reports that she does not use drugs. Social History   Socioeconomic History   Marital status: Unknown    Spouse name: Advertising account planner - husband   Number of children: Not on file   Years of education: Not on file   Highest education level: Not on file  Occupational History   Not on file  Tobacco Use   Smoking status: Never   Smokeless tobacco: Never  Vaping Use   Vaping status: Never Used  Substance and Sexual Activity   Alcohol use: Not Currently   Drug use: Never   Sexual activity: Yes  Other Topics Concern   Not on file  Social History Narrative   Not on file   Social Drivers of Health   Financial Resource Strain: Not on file  Food Insecurity: No Food Insecurity (10/26/2023)   Hunger Vital Sign    Worried About Running Out of Food in the Last Year: Never true    Ran Out of Food in the Last Year: Never true  Transportation Needs: No Transportation Needs (10/26/2023)   PRAPARE - Administrator, Civil Service (Medical): No    Lack of Transportation (Non-Medical): No  Physical Activity: Not on file  Stress: Not on file  Social Connections: Not on file  Intimate Partner Violence: Not At Risk (10/26/2023)   Humiliation, Afraid, Rape, and Kick questionnaire    Fear of Current or Ex-Partner: No    Emotionally Abused: No    Physically Abused: No    Sexually Abused: No    FAMILY HISTORY: History reviewed. No pertinent family history.  ALLERGIES:  She has no known allergies.  MEDICATIONS:  Current Outpatient Medications  Medication Sig Dispense Refill   acetaminophen (TYLENOL) 325 MG tablet Take 2 tablets (650 mg total) by mouth every 4 (four) hours as needed for moderate pain (for pain scale < 4). 60 tablet 3   ibuprofen (ADVIL) 600 MG tablet Take 1 tablet (600 mg total) by mouth every 6 (six) hours as needed for cramping. 60 tablet 3   senna-docusate (SENOKOT-S) 8.6-50 MG tablet Take 2 tablets by mouth every 12  (twelve) hours as needed for mild constipation. (Patient not taking: Reported on 10/26/2023) 60 tablet 3   No current facility-administered medications for this visit.    REVIEW OF SYSTEMS:    Review of Systems - Oncology  All other pertinent systems were reviewed and were negative except as mentioned above.  PHYSICAL EXAMINATION:   Onc Performance Status - 10/26/23 0900       ECOG Perf Status   ECOG Perf Status Fully active, able to carry on all pre-disease performance without restriction      KPS SCALE   KPS % SCORE Normal, no compliants, no evidence of disease             Vitals:   10/26/23 0900  BP: (!) 111/90  Pulse: 84  Resp: 18  Temp: (!) 97 F (36.1 C)  SpO2: 100%   Filed Weights   10/26/23 0900  Weight: 198 lb 11.2 oz (90.1 kg)    Physical Exam Constitutional:      General: She is not in acute distress.    Appearance: Normal appearance.  HENT:  Head: Normocephalic and atraumatic.  Eyes:     General: No scleral icterus.    Conjunctiva/sclera: Conjunctivae normal.  Cardiovascular:     Rate and Rhythm: Normal rate and regular rhythm.     Heart sounds: Normal heart sounds.  Pulmonary:     Effort: Pulmonary effort is normal.     Breath sounds: Normal breath sounds.  Abdominal:     General: There is no distension.  Neurological:     General: No focal deficit present.     Mental Status: She is alert and oriented to person, place, and time.  Psychiatric:        Mood and Affect: Mood normal.        Behavior: Behavior normal.       LABORATORY DATA:   I have reviewed the data as listed.  Results for orders placed or performed in visit on 10/26/23  Folate  Result Value Ref Range   Folate 19.8 >5.9 ng/mL  Iron and Iron Binding Capacity ([facility] only)  Result Value Ref Range   Iron 113 28 - 170 ug/dL   TIBC 161 (H) 096 - 045 ug/dL   Saturation Ratios 23 10.4 - 31.8 %   UIBC 380 148 - 442 ug/dL  Lactate dehydrogenase  Result Value Ref  Range   LDH 117 98 - 192 U/L  CMP (Cancer Center only)  Result Value Ref Range   Sodium 139 135 - 145 mmol/L   Potassium 4.0 3.5 - 5.1 mmol/L   Chloride 107 98 - 111 mmol/L   CO2 27 22 - 32 mmol/L   Glucose, Bld 100 (H) 70 - 99 mg/dL   BUN 11 6 - 20 mg/dL   Creatinine 4.09 8.11 - 1.00 mg/dL   Calcium 9.6 8.9 - 91.4 mg/dL   Total Protein 7.9 6.5 - 8.1 g/dL   Albumin 4.6 3.5 - 5.0 g/dL   AST 14 (L) 15 - 41 U/L   ALT 14 0 - 44 U/L   Alkaline Phosphatase 52 38 - 126 U/L   Total Bilirubin 0.4 0.0 - 1.2 mg/dL   GFR, Estimated >78 >29 mL/min   Anion gap 5 5 - 15  CBC with Differential (Cancer Center Only)  Result Value Ref Range   WBC Count 5.3 4.0 - 10.5 K/uL   RBC 5.21 (H) 3.87 - 5.11 MIL/uL   Hemoglobin 12.2 12.0 - 15.0 g/dL   HCT 56.2 13.0 - 86.5 %   MCV 75.6 (L) 80.0 - 100.0 fL   MCH 23.4 (L) 26.0 - 34.0 pg   MCHC 31.0 30.0 - 36.0 g/dL   RDW 78.4 (H) 69.6 - 29.5 %   Platelet Count 253 150 - 400 K/uL   nRBC 0.0 0.0 - 0.2 %   Neutrophils Relative % 55 %   Neutro Abs 2.9 1.7 - 7.7 K/uL   Lymphocytes Relative 35 %   Lymphs Abs 1.8 0.7 - 4.0 K/uL   Monocytes Relative 6 %   Monocytes Absolute 0.3 0.1 - 1.0 K/uL   Eosinophils Relative 3 %   Eosinophils Absolute 0.2 0.0 - 0.5 K/uL   Basophils Relative 1 %   Basophils Absolute 0.1 0.0 - 0.1 K/uL   Immature Granulocytes 0 %   Abs Immature Granulocytes 0.01 0.00 - 0.07 K/uL    RADIOGRAPHIC STUDIES:  No recent pertinent imaging studies available to review.  Orders Placed This Encounter  Procedures   CBC with Differential (Cancer Center Only)    Standing Status:   Future  Number of Occurrences:   1    Expiration Date:   10/25/2024   CMP (Cancer Center only)    Standing Status:   Future    Number of Occurrences:   1    Expiration Date:   10/25/2024   Lactate dehydrogenase    Standing Status:   Future    Number of Occurrences:   1    Expiration Date:   10/25/2024   Iron and Iron Binding Capacity ([facility] only)     Standing Status:   Future    Number of Occurrences:   1    Expiration Date:   10/25/2024   Ferritin    Standing Status:   Future    Number of Occurrences:   1    Expiration Date:   10/25/2024   Folate    Standing Status:   Future    Number of Occurrences:   1    Expiration Date:   10/25/2024   Hgb Fractionation Cascade    Standing Status:   Future    Number of Occurrences:   1    Expiration Date:   10/25/2024   Alpha-Thalassemia GenotypR    Standing Status:   Future    Number of Occurrences:   1    Expiration Date:   10/25/2024    Future Appointments  Date Time Provider Department Center  02/01/2024  3:15 PM [facility] None  02/01/2024  3:45 PM Ladarrian Asencio, MD [facility] None     I spent a total of 40 minutes during this encounter with the patient including review of chart and various tests results, discussions about plan of care and coordination of care plan.  This document was completed utilizing speech recognition software. Grammatical errors, random word insertions, pronoun errors, and incomplete sentences are an occasional consequence of this system due to software limitations, ambient noise, and hardware issues. Any formal questions or concerns about the content, text or information contained within the body of this dictation should be directly addressed to the provider for clarification.

## 2023-10-30 ENCOUNTER — Encounter: Payer: Self-pay | Admitting: Oncology

## 2023-10-30 LAB — HGB SOLUBILITY: Hgb Solubility: POSITIVE — AB

## 2023-10-30 LAB — HGB FRACTIONATION CASCADE
Hgb A2: 2.7 % (ref 1.8–3.2)
Hgb A: 63.6 % — ABNORMAL LOW (ref 96.4–98.8)
Hgb F: 4 % — ABNORMAL HIGH (ref 0.0–2.0)
Hgb S: 33.7 % — ABNORMAL HIGH

## 2023-11-03 LAB — ALPHA-THALASSEMIA GENOTYPR

## 2023-11-06 ENCOUNTER — Other Ambulatory Visit: Payer: Self-pay | Admitting: Nurse Practitioner

## 2023-11-06 MED ORDER — FERROUS SULFATE 325 (65 FE) MG PO TBEC: 325.0000 mg | DELAYED_RELEASE_TABLET | Freq: Every day | ORAL | 2 refills | Status: AC

## 2023-11-22 ENCOUNTER — Telehealth: Payer: Self-pay | Admitting: Oncology

## 2023-11-22 NOTE — Telephone Encounter (Signed)
 Completed - Re-scheduled patient's appts. Patient requested a sooner appt. Patient is scheduled and aware of all details.

## 2023-12-12 ENCOUNTER — Telehealth: Payer: Self-pay | Admitting: Oncology

## 2023-12-12 NOTE — Telephone Encounter (Signed)
 Interpretor left a detailed message of rescheduled appointment details, and the reasoning for reschedulin as the MD will be on PAL.

## 2023-12-22 ENCOUNTER — Other Ambulatory Visit

## 2023-12-22 ENCOUNTER — Ambulatory Visit: Admitting: Oncology

## 2023-12-28 ENCOUNTER — Other Ambulatory Visit: Payer: Self-pay | Admitting: Oncology

## 2023-12-28 DIAGNOSIS — D509 Iron deficiency anemia, unspecified: Secondary | ICD-10-CM

## 2023-12-29 ENCOUNTER — Inpatient Hospital Stay

## 2023-12-29 ENCOUNTER — Inpatient Hospital Stay: Admitting: Oncology

## 2023-12-29 ENCOUNTER — Telehealth: Payer: Self-pay

## 2023-12-29 NOTE — Telephone Encounter (Signed)
 Patient had no voicemail but a call was attempted in order to notify of missed appt with Dr. Randye Buttner and Lab.

## 2024-01-04 ENCOUNTER — Telehealth: Payer: Self-pay | Admitting: Oncology

## 2024-01-04 NOTE — Telephone Encounter (Signed)
 Andrea Escobar stated that she will call us  to reschedule as she is having issues with her insurance.

## 2024-02-01 ENCOUNTER — Other Ambulatory Visit: Payer: Medicaid Other

## 2024-02-01 ENCOUNTER — Ambulatory Visit: Payer: Medicaid Other | Admitting: Oncology

## 2024-09-18 ENCOUNTER — Ambulatory Visit: Payer: Self-pay
